# Patient Record
Sex: Female | Born: 1970
Health system: Southern US, Community
[De-identification: ages and names within clinical notes are randomized; demographics above are authoritative.]

## PROBLEM LIST (undated history)

## (undated) DIAGNOSIS — R011 Cardiac murmur, unspecified: Secondary | ICD-10-CM

## (undated) DIAGNOSIS — R112 Nausea with vomiting, unspecified: Secondary | ICD-10-CM

## (undated) DIAGNOSIS — B009 Herpesviral infection, unspecified: Secondary | ICD-10-CM

## (undated) DIAGNOSIS — N94819 Vulvodynia, unspecified: Secondary | ICD-10-CM

## (undated) DIAGNOSIS — F329 Major depressive disorder, single episode, unspecified: Secondary | ICD-10-CM

## (undated) DIAGNOSIS — Z8719 Personal history of other diseases of the digestive system: Secondary | ICD-10-CM

## (undated) DIAGNOSIS — S0993XA Unspecified injury of face, initial encounter: Secondary | ICD-10-CM

## (undated) DIAGNOSIS — R102 Pelvic and perineal pain: Secondary | ICD-10-CM

## (undated) DIAGNOSIS — IMO0002 Reserved for concepts with insufficient information to code with codable children: Secondary | ICD-10-CM

## (undated) DIAGNOSIS — B379 Candidiasis, unspecified: Secondary | ICD-10-CM

## (undated) DIAGNOSIS — R87619 Unspecified abnormal cytological findings in specimens from cervix uteri: Secondary | ICD-10-CM

## (undated) DIAGNOSIS — N809 Endometriosis, unspecified: Secondary | ICD-10-CM

## (undated) DIAGNOSIS — Z9889 Other specified postprocedural states: Secondary | ICD-10-CM

## (undated) DIAGNOSIS — F419 Anxiety disorder, unspecified: Secondary | ICD-10-CM

## (undated) DIAGNOSIS — F32A Depression, unspecified: Secondary | ICD-10-CM

## (undated) HISTORY — DX: Personal history of other diseases of the digestive system: Z87.19

## (undated) HISTORY — DX: Reserved for concepts with insufficient information to code with codable children: IMO0002

## (undated) HISTORY — DX: Unspecified abnormal cytological findings in specimens from cervix uteri: R87.619

## (undated) HISTORY — DX: Endometriosis, unspecified: N80.9

## (undated) HISTORY — DX: Candidiasis, unspecified: B37.9

## (undated) HISTORY — DX: Unspecified injury of face, initial encounter: S09.93XA

## (undated) HISTORY — PX: PELVIC LAPAROSCOPY: SHX162

## (undated) HISTORY — DX: Vulvodynia, unspecified: N94.819

## (undated) HISTORY — DX: Herpesviral infection, unspecified: B00.9

## (undated) HISTORY — DX: Pelvic and perineal pain: R10.2

## (undated) HISTORY — DX: Anxiety disorder, unspecified: F41.9

---

## 1995-06-10 HISTORY — PX: BREAST REDUCTION SURGERY: SHX8

## 1998-09-06 ENCOUNTER — Ambulatory Visit (HOSPITAL_COMMUNITY): Admission: RE | Admit: 1998-09-06 | Discharge: 1998-09-06 | Payer: Self-pay | Admitting: Obstetrics and Gynecology

## 1998-09-06 ENCOUNTER — Encounter: Payer: Self-pay | Admitting: Obstetrics and Gynecology

## 1998-11-18 ENCOUNTER — Inpatient Hospital Stay (HOSPITAL_COMMUNITY): Admission: AD | Admit: 1998-11-18 | Discharge: 1998-11-18 | Payer: Self-pay | Admitting: *Deleted

## 1999-02-05 ENCOUNTER — Inpatient Hospital Stay (HOSPITAL_COMMUNITY): Admission: AD | Admit: 1999-02-05 | Discharge: 1999-02-08 | Payer: Self-pay | Admitting: Obstetrics and Gynecology

## 2000-06-09 HISTORY — PX: TUBAL LIGATION: SHX77

## 2000-11-01 ENCOUNTER — Emergency Department (HOSPITAL_COMMUNITY): Admission: EM | Admit: 2000-11-01 | Discharge: 2000-11-01 | Payer: Self-pay | Admitting: Emergency Medicine

## 2001-01-10 ENCOUNTER — Inpatient Hospital Stay (HOSPITAL_COMMUNITY): Admission: AD | Admit: 2001-01-10 | Discharge: 2001-01-12 | Payer: Self-pay | Admitting: Obstetrics and Gynecology

## 2001-01-10 ENCOUNTER — Encounter (INDEPENDENT_AMBULATORY_CARE_PROVIDER_SITE_OTHER): Payer: Self-pay | Admitting: Specialist

## 2001-09-08 ENCOUNTER — Other Ambulatory Visit: Admission: RE | Admit: 2001-09-08 | Discharge: 2001-09-08 | Payer: Self-pay | Admitting: Obstetrics and Gynecology

## 2002-09-27 ENCOUNTER — Other Ambulatory Visit: Admission: RE | Admit: 2002-09-27 | Discharge: 2002-09-27 | Payer: Self-pay | Admitting: Obstetrics and Gynecology

## 2003-06-10 HISTORY — PX: ABDOMINAL HYSTERECTOMY: SHX81

## 2003-09-25 ENCOUNTER — Encounter: Admission: RE | Admit: 2003-09-25 | Discharge: 2003-09-25 | Payer: Self-pay | Admitting: Allergy and Immunology

## 2003-10-02 ENCOUNTER — Other Ambulatory Visit: Admission: RE | Admit: 2003-10-02 | Discharge: 2003-10-02 | Payer: Self-pay | Admitting: Obstetrics and Gynecology

## 2003-10-05 ENCOUNTER — Ambulatory Visit (HOSPITAL_COMMUNITY): Admission: RE | Admit: 2003-10-05 | Discharge: 2003-10-05 | Payer: Self-pay | Admitting: Obstetrics and Gynecology

## 2003-10-05 ENCOUNTER — Encounter (INDEPENDENT_AMBULATORY_CARE_PROVIDER_SITE_OTHER): Payer: Self-pay | Admitting: Specialist

## 2004-05-30 ENCOUNTER — Ambulatory Visit (HOSPITAL_COMMUNITY): Admission: RE | Admit: 2004-05-30 | Discharge: 2004-05-30 | Payer: Self-pay | Admitting: Gastroenterology

## 2004-09-30 ENCOUNTER — Other Ambulatory Visit: Admission: RE | Admit: 2004-09-30 | Discharge: 2004-09-30 | Payer: Self-pay | Admitting: Obstetrics and Gynecology

## 2005-02-03 ENCOUNTER — Encounter (INDEPENDENT_AMBULATORY_CARE_PROVIDER_SITE_OTHER): Payer: Self-pay | Admitting: *Deleted

## 2005-02-03 ENCOUNTER — Observation Stay (HOSPITAL_COMMUNITY): Admission: RE | Admit: 2005-02-03 | Discharge: 2005-02-04 | Payer: Self-pay | Admitting: Obstetrics and Gynecology

## 2005-05-28 IMAGING — CT CT PARANASAL SINUSES LIMITED
1 series · 16 of 24 positions shown, 20 images · non-contrast
Comparison: none

CLINICAL DATA: Facial pain and sinus congestion.
 CT OF THE SINUSES LIMITED WITHOUT CONTRAST
 With the patient in the extended prone position, contiguous 3.75 mm cuts were obtained through the paranasal sinuses.

[Series 2: — · axial · 0.33mm/px · z∈[+17,+102]mm · 16 of 24 slices shown, 20 images]
[im 2/24  brain]
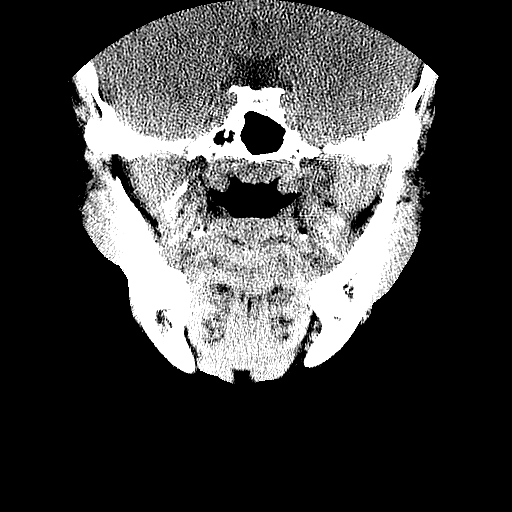
[im 2/24  bone]
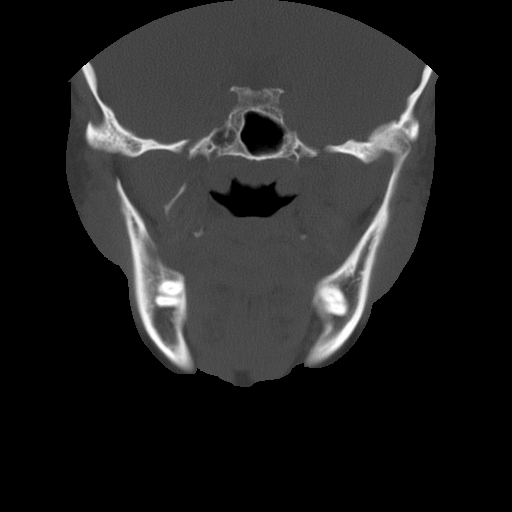
[im 4/24  bone]
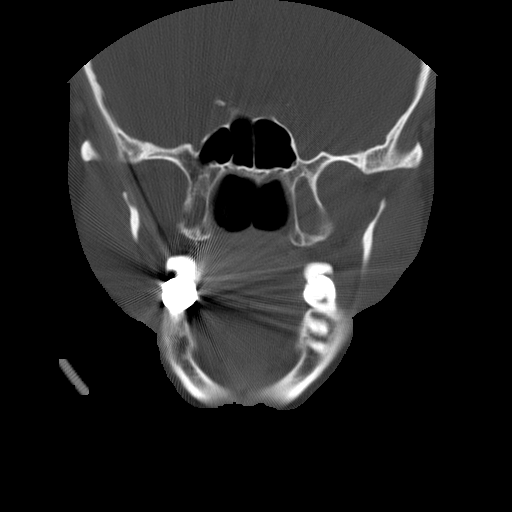
[im 5/24  bone]
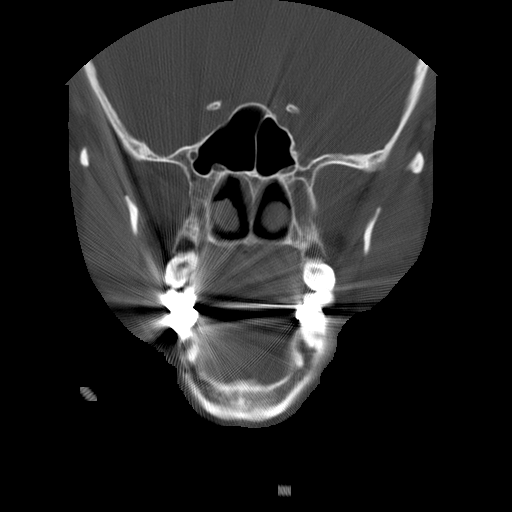
[im 6/24  bone]
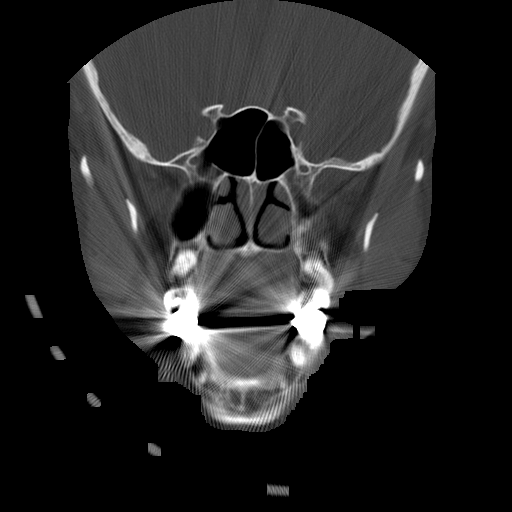
[im 8/24  brain]
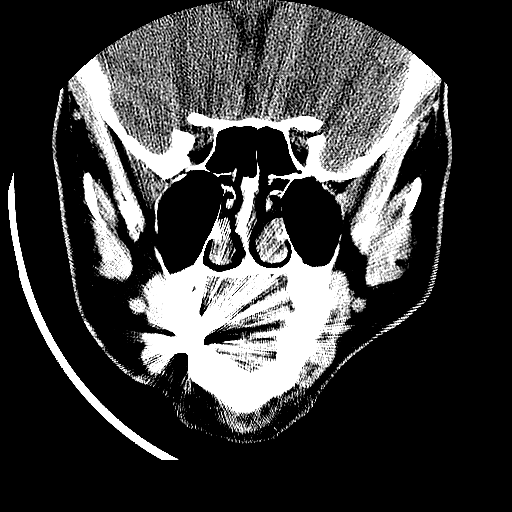
[im 8/24  bone]
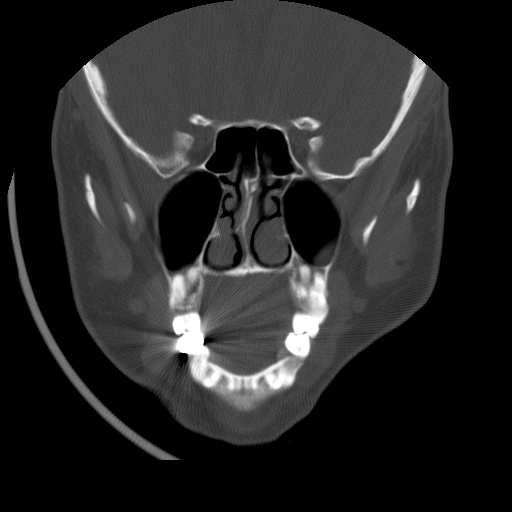
[im 9/24  bone]
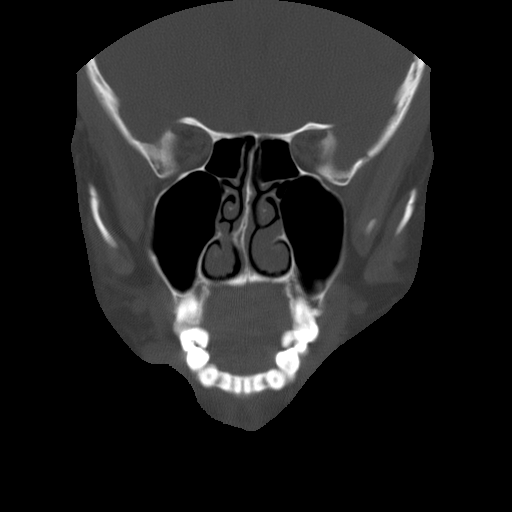
[im 10/24  bone]
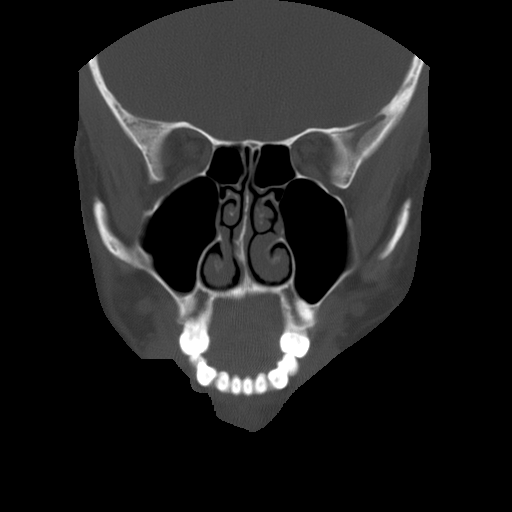
[im 12/24  bone]
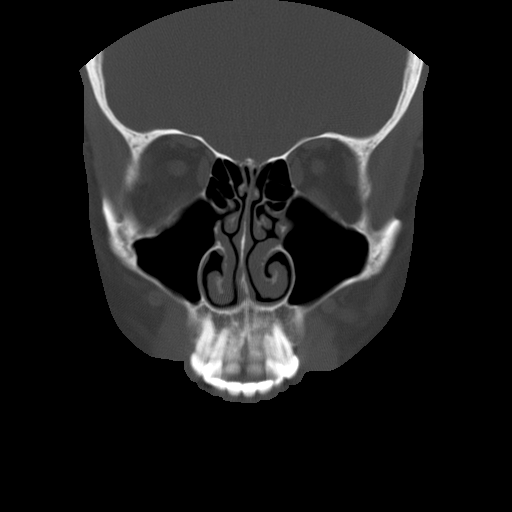
[im 13/24  brain]
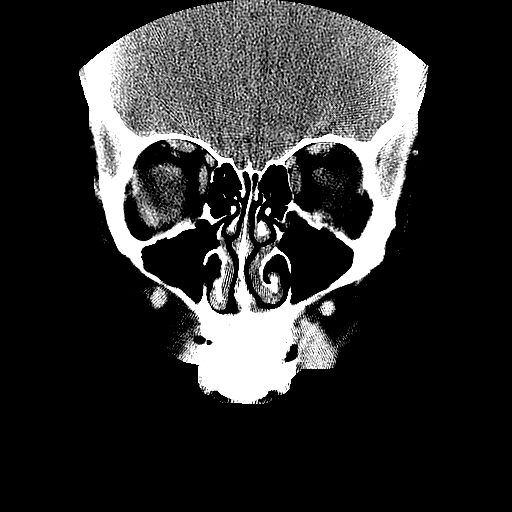
[im 13/24  bone]
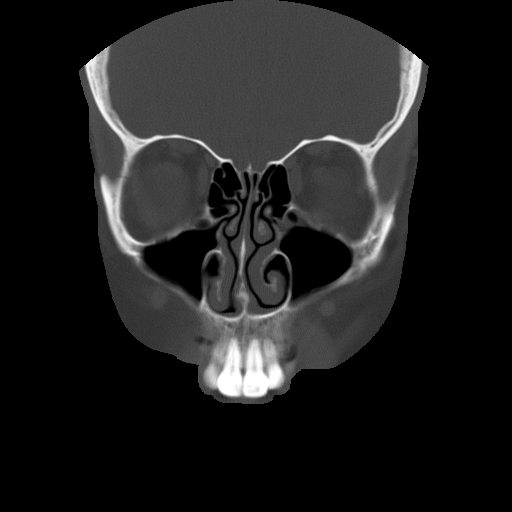
[im 15/24  bone]
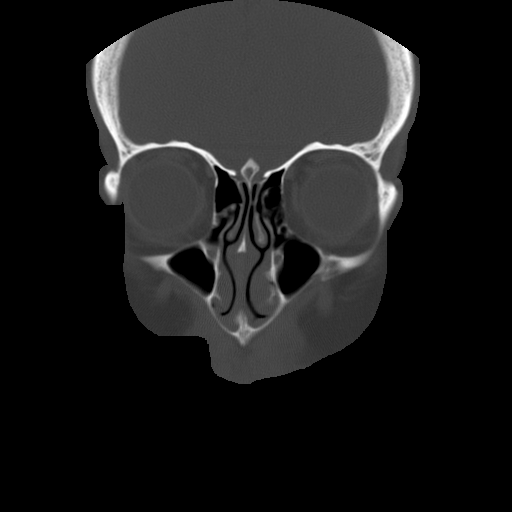
[im 16/24  bone]
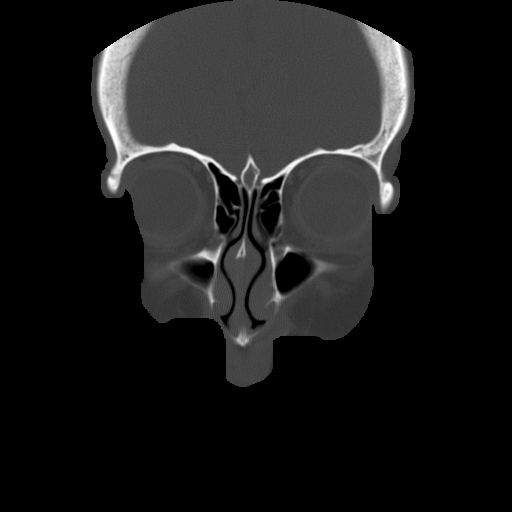
[im 17/24  bone]
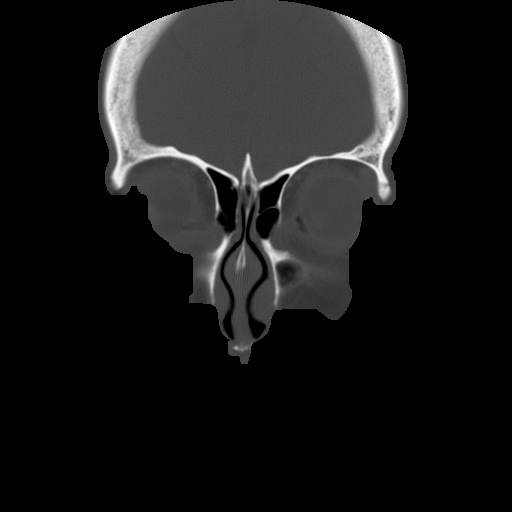
[im 19/24  brain]
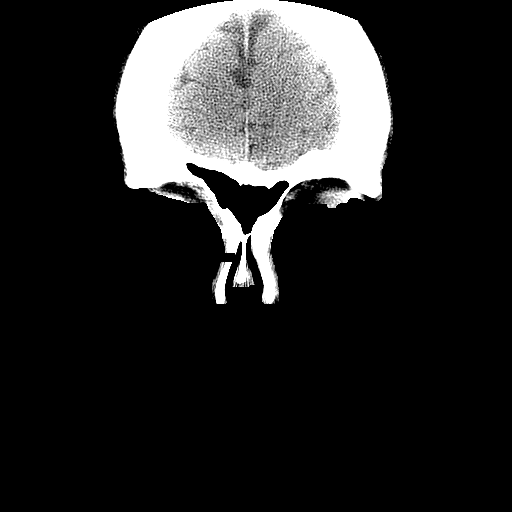
[im 19/24  bone]
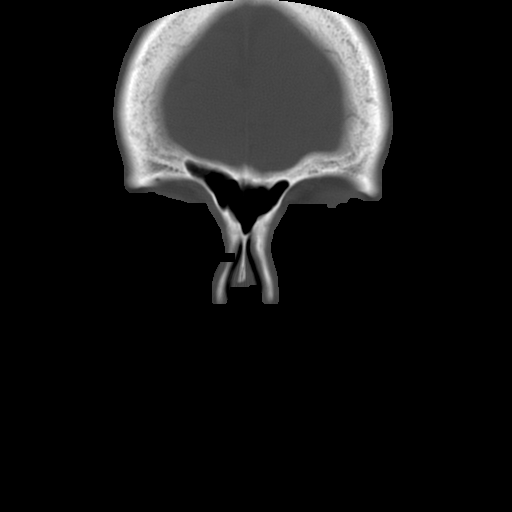
[im 20/24  bone]
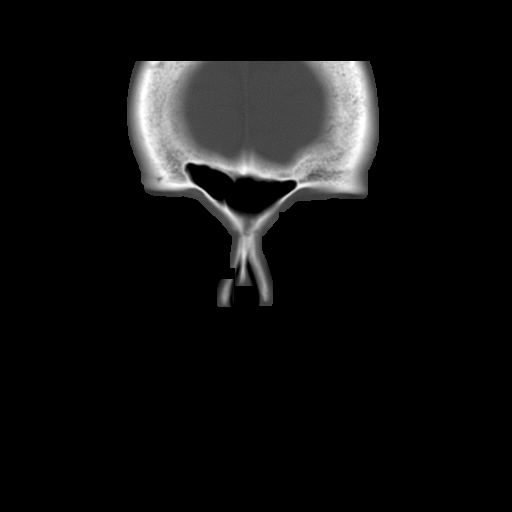
[im 21/24  bone]
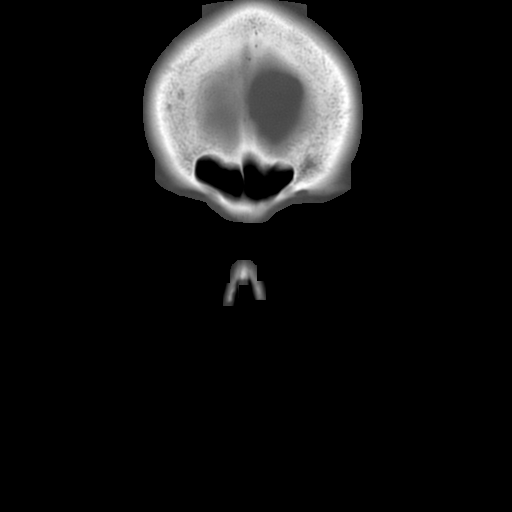
[im 23/24  bone]
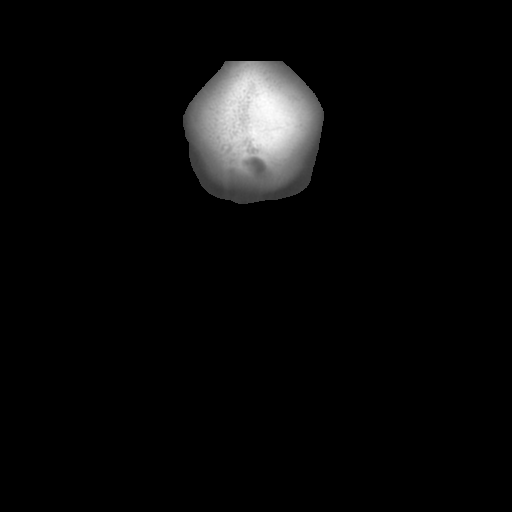

[16 of 24 positions shown; findings below may reference images not displayed]

Frontal and ethmoid sinuses clear.  Mild bilateral maxillary mucosal thickening.  Sphenoid sinus is normal.  The ostiomeatal unit cannot be fully evaluated on a limited study such as this.  However, there appears to be no grossly unfavorable anatomic.
 IMPRESSION
 Mild bilateral maxillary mucosal thickening.  Otherwise normal.

## 2005-11-05 ENCOUNTER — Other Ambulatory Visit: Admission: RE | Admit: 2005-11-05 | Discharge: 2005-11-05 | Payer: Self-pay | Admitting: Obstetrics and Gynecology

## 2006-01-31 ENCOUNTER — Emergency Department (HOSPITAL_COMMUNITY): Admission: EM | Admit: 2006-01-31 | Discharge: 2006-01-31 | Payer: Self-pay | Admitting: Emergency Medicine

## 2009-11-06 ENCOUNTER — Ambulatory Visit: Payer: Self-pay | Admitting: Internal Medicine

## 2009-11-06 DIAGNOSIS — R198 Other specified symptoms and signs involving the digestive system and abdomen: Secondary | ICD-10-CM

## 2009-11-06 DIAGNOSIS — R1032 Left lower quadrant pain: Secondary | ICD-10-CM | POA: Insufficient documentation

## 2009-11-06 DIAGNOSIS — R635 Abnormal weight gain: Secondary | ICD-10-CM | POA: Insufficient documentation

## 2009-11-06 DIAGNOSIS — R197 Diarrhea, unspecified: Secondary | ICD-10-CM

## 2009-11-08 ENCOUNTER — Encounter: Payer: Self-pay | Admitting: Gastroenterology

## 2009-11-26 ENCOUNTER — Encounter: Payer: Self-pay | Admitting: Internal Medicine

## 2009-12-12 ENCOUNTER — Encounter (INDEPENDENT_AMBULATORY_CARE_PROVIDER_SITE_OTHER): Payer: Self-pay

## 2009-12-12 LAB — CONVERTED CEMR LAB
ALT: 11 units/L (ref 0–35)
AST: 16 units/L (ref 0–37)
Albumin: 4.6 g/dL (ref 3.5–5.2)
Alkaline Phosphatase: 40 units/L (ref 39–117)
BUN: 18 mg/dL (ref 6–23)
Basophils Absolute: 0 10*3/uL (ref 0.0–0.1)
Basophils Relative: 0 % (ref 0–1)
CO2: 21 meq/L (ref 19–32)
Calcium: 9.5 mg/dL (ref 8.4–10.5)
Chloride: 105 meq/L (ref 96–112)
Creatinine, Ser: 0.68 mg/dL (ref 0.40–1.20)
Eosinophils Absolute: 0 10*3/uL (ref 0.0–0.7)
Eosinophils Relative: 1 % (ref 0–5)
Glucose, Bld: 84 mg/dL (ref 70–99)
HCT: 38.9 % (ref 36.0–46.0)
Hemoglobin: 13 g/dL (ref 12.0–15.0)
Lymphocytes Relative: 27 % (ref 12–46)
Lymphs Abs: 1.7 10*3/uL (ref 0.7–4.0)
MCHC: 33.4 g/dL (ref 30.0–36.0)
MCV: 98 fL (ref 78.0–100.0)
Monocytes Absolute: 0.6 10*3/uL (ref 0.1–1.0)
Monocytes Relative: 10 % (ref 3–12)
Neutro Abs: 4 10*3/uL (ref 1.7–7.7)
Neutrophils Relative %: 62 % (ref 43–77)
Platelets: 200 10*3/uL (ref 150–400)
Potassium: 4.5 meq/L (ref 3.5–5.3)
RBC: 3.97 M/uL (ref 3.87–5.11)
RDW: 12.7 % (ref 11.5–15.5)
Sodium: 137 meq/L (ref 135–145)
TSH: 1.902 microintl units/mL (ref 0.350–4.500)
Total Bilirubin: 0.3 mg/dL (ref 0.3–1.2)
Total Protein: 7.5 g/dL (ref 6.0–8.3)
WBC: 6.4 10*3/uL (ref 4.0–10.5)

## 2010-07-09 NOTE — Assessment & Plan Note (Signed)
Summary: ABD PAIN,DIARRHEA/CM   Visit Type:  New patient Primary Care Provider:  [facility]  Chief Complaint:  abd pain, diarrhea, and weight gain.  History of Present Illness: Michelle Combs is a pleasant 40 y/o WF, who presents today for further evaluation of abd pain, diarrhea, weight gain. She states her symptoms began about 9 months ago. She came off Imipramine which she had been taking for migraine H/As. She was on it for five years, 150mg  per day. The last two months she weaned herself off. She also switched from Nexium to omeprazole and added Wellbutrin around the time her symptoms began. She went from constipation to having profuse diarrhea several days per week. No nocturnal symptoms. No blood in stool. When the diarrhea occurs, she goes to bathroom all day long, finally passing nothing but watery stools. She has some mild intermittent LLQ pain. She describes three episodes of feeling like someone has punched her in mid-abdomen. This lasted for one day. Not brought on by meals. She rarely has heartburn, but will take omeprazole as needed. She was diagnosed with GERD by ENT in 2005 when she presented with hoarseness. Nexium was started at that time and her symptoms resolved. She had EGD by Dr. Laural Benes at that time which was normal. She c/o weight gain of 10 pounds recently with no dietary changes.      Current Medications (verified): 1)  Wellbutrin Xl 300 Mg Xr24h-Tab (Bupropion Hcl) .... Once Daily 2)  Omeprazole 20 Mg Cpdr (Omeprazole) .... As Needed  Allergies (verified): No Known Drug Allergies  Past History:  Past Medical History: EGD, Dr. Danise Edge, 2005-->normal Incomplete TCS to ascending colon secondary to looping, Dr. Danise Edge, 2005--> GERD Migraines Depression  Past Surgical History: Hysterectomy for chronic pelvic pain, endometriosis, 2006 Dx lap for endometriosis, 2005 Tubal ligation, 2002 Breast reduction, 1998  Family History: No FH  of CRC, liver, chronic GI illnesses.  Social History: Married. Two children. [employer] Never smoked. Red wine, glass a night. No drugs use.  Review of Systems General:  Denies fever, chills, sweats, anorexia, fatigue, weakness, and weight loss. Eyes:  Denies vision loss. ENT:  Denies nasal congestion, sore throat, hoarseness, and difficulty swallowing. CV:  Denies chest pains, angina, dyspnea on exertion, and peripheral edema. Resp:  Denies dyspnea at rest, dyspnea with exercise, and cough. GI:  See HPI. GU:  Denies urinary burning and blood in urine. MS:  Denies joint pain / LOM. Derm:  Denies rash and itching. Neuro:  Complains of headache; denies weakness, frequent headaches, memory loss, and confusion. Psych:  Complains of depression; denies anxiety, memory loss, and suicidal ideation. Endo:  Complains of unusual weight change. Heme:  Denies bruising and bleeding. Allergy:  Denies hives and rash.  Vital Signs:  Patient profile:   40 year old female Height:      62 inches Weight:      137 pounds BMI:     25.15 Temp:     97.4 degrees F oral Pulse rate:   80 / minute BP sitting:   118 / 78  (left arm) Cuff size:   regular  Vitals Entered By: Hendricks Limes LPN (Nov 06, 2009 8:34 AM)  Physical Exam  General:  Well developed, well nourished, no acute distress. Head:  Normocephalic and atraumatic. Eyes:  Conjunctivae pink, no scleral icterus.  Mouth:  Oropharyngeal mucosa moist, pink.  No lesions, erythema or exudate.    Neck:  Supple; no masses or thyromegaly.  Lungs:  Clear throughout to auscultation. Heart:  Regular rate and rhythm; no murmurs, rubs,  or bruits. Abdomen:  Normal BS. Soft. Mild LLQ tenderness to deep palpation. No rebound or guarding. No HSM or masses. Extremities:  No clubbing, cyanosis, edema or deformities noted. Neurologic:  Alert and  oriented x4;  grossly normal neurologically. Skin:  Intact without significant lesions or  rashes. Cervical Nodes:  No significant cervical adenopathy. Psych:  Alert and cooperative. Normal mood and affect.  Impression & Recommendations:  Problem # 1:  CHANGE IN BOWELS (ICD-787.99) Change in bowels from constipation to diarrhea several days per week. Change noted around time she stopped imipramine and started wellbutrin. Previously had incomplete TCS for rectal bleeding in 2005. ?underlying IBS masked by use of imipramine before. Need to obtain routine labs and stools. Will add fiber, hyomax. May ultimately need TCS/TI for further evaluation.  Orders: New Patient Level III [phone]) T-TSH [phone]) T-Comprehensive Metabolic Panel [id number]) T-CBC w/Diff [id number])  Problem # 2:  DIARRHEA (ICD-787.91) see #1 Orders: New Patient Level III ([address]295) T-Comprehensive Metabolic Panel [id number]) T-CBC w/Diff [id number]) T-Culture, Stool ([id number]) T-Stool Giardia / Crypto- EIA (03474) T-Fecal WBC ([id number]) T-Culture, C-Diff Toxin A/B ([id number])  Problem # 3:  ABDOMINAL PAIN, LEFT LOWER QUADRANT (ICD-789.04) LLQ tenderness on exam. ?IBS? Trial of hyomax and w/u per #1. Orders: New Patient Level III (16606) T-Comprehensive Metabolic Panel [id number]) T-CBC w/Diff [id number]) T-Culture, Stool ([id number]) T-Stool Giardia / Crypto- EIA (42706) T-Fecal WBC ([id number]) T-Culture, C-Diff Toxin A/B ([id number])  Problem # 4:  WEIGHT GAIN (ICD-783.1) ?related to medication. Will check thyroid function. Orders: New Patient Level III (62694) T-TSH [id number])  Patient Instructions: 1)  Please start fiber supplement such as Benefiber or Fiberchoice, one dose daily. 2)  Take hyoscyamine sublingual for diarrhea.  3)  Have labs and stool studies done as soon as you can.  4)  The medication list was reviewed and reconciled.  All changed / newly prescribed medications were explained.  A complete medication list was provided to the patient /  caregiver. Prescriptions: HYOMAX-SL 0.125 MG SUBL (HYOSCYAMINE SULFATE) one by mouth SL up to four times daily for diarrhea.  #90 x 0   Entered and Authorized by:   Leanna Battles. Dixon Boos   Signed by:   Leanna Battles Dixon Boos on 11/06/2009   Method used:   Electronically to        [facility] (retail)       [address]       [place], [place]  [address]       Ph: [phone]       Fax: [phone]   RxID:   [id number]

## 2010-07-09 NOTE — Letter (Signed)
Summary: op report-esophagogastroduodenoscopy-colonoscopy-dr. Laural Benes  op report-esophagogastroduodenoscopy-colonoscopy-dr. Laural Benes   Imported By: Rosine Beat 11/26/2009 14:53:40  _____________________________________________________________________  External Attachment:    Type:   Image     Comment:   External Document

## 2010-07-09 NOTE — Letter (Signed)
Summary: Plan of Care, Need to Discuss  [facility]  [address]   [place], [place] [address]   Phone: [phone]  Fax: [phone]    December 12, 2009  Michelle Combs [address] [place], [place]  [address] 1971-01-20   Dear Ms. Polo Riley,   We are writing this letter to inform you of treatment plans and/or discuss your plan of care.  We have tried several times to contact you; however, we have yet to reach you.  We ask that you please contact our office for follow-up on your gastrointestinal issues.  We can  be reached at [phone] to schedule an appointment, or to speak with someone regarding your health care needs.  Please do not neglect your health.   Sincerely,    Cloria Spring LPN  [facility] Ph: [phone]    Fax: [phone]

## 2010-10-25 NOTE — Discharge Summary (Signed)
[facility]  Patient:    Michelle Combs, Michelle Combs                       MRN: [id number] Adm. Date:  [id number] Disc. Date: [id number] Attending:  Shaune Spittle Dictator:   Nigel Bridgeman, C.N.M.                           Discharge Summary  ADMITTING DIAGNOSES:          1. Intrauterine pregnancy at term.                               2. Desires tubal sterilization.  DISCHARGE DIAGNOSES:          1. Intrauterine pregnancy at term.                               2. Desires tubal sterilization.  PROCEDURES:                   1. Spontaneous vaginal delivery at term.                               2. Postpartum tubal sterilization.  HOSPITAL COURSE:              Michelle Combs is a 40 year old, gravida 4, para 1-0-2-1 at 39-3/7 weeks, who presented in active labor on the morning of January 10, 2001. She was 3 to 4 cm with an intact bag of water. Pregnancy had been remarkable for (1) history of two Abs, (2) history of abnormal Pap smear, (3) history of breast reduction, (4) positive group B strep, and (5) first trimester spotting. Penicillin G was initiated for group B strep prophylaxis. The patient had an epidural but then progressed very rapidly to a normal spontaneous vaginal delivery over an intact perineum. She had a viable female by the name of Kaleb, weight 7 pounds 12 ounces, Apgars were 9 and 9. The patient had no complications. The infant was taken to the full-term nursery. Mother was taken to the recovery room in good condition. The patient did desire tubal sterilization. Risks and benefits were reviewed with Dr. Pennie Rushing. Later on the morning of January 10, 2001 she was taken to the operating room where a postpartum bilateral tubal ligation was performed under existing epidural and additional local anesthesia. The patient had no complications. Blood loss was minimal.  By postoperative day #1, postpartum day #1, the patient was doing well. Her hemoglobin was  9.5, down from 11.1. WBC count was 7.8. Physical exam was within normal limits. Her platelet count was 111,000, down from 145,000. She was tolerating a regular diet. She was bottle feeding.  On postpartum day #2, postoperative day #2, she was up ad lib. Her incision was clean, dry and intact. Her physical exam was within normal limits. She was deemed to have received the full benefit of her hospital stay, and was discharged home.  DISCHARGE INSTRUCTIONS:       Instructions per [facility] handout.  DISCHARGE MEDICATIONS:        1. Motrin 600 mg p.o. q.6h. p.r.n. pain.  2. Tylox one to two p.o. q.3-4h. p.r.n. pain.  DISCHARGE FOLLOWUP:           The patient is to follow up in six weeks at [facility]. DD:  01/12/01 TD:  01/12/01 Job: [id number]

## 2010-10-25 NOTE — H&P (Signed)
NAME:  Michelle Combs, Michelle Combs                          ACCOUNT NO.:  [id number]   MEDICAL RECORD NO.:  [id number]                   PATIENT TYPE:  AMB   LOCATION:  [facility]                                  FACILITY:  [facility]   PHYSICIAN:  Hal Morales, M.D.             DATE OF BIRTH:  1971/05/14   DATE OF ADMISSION:  10/05/2003  DATE OF DISCHARGE:                                HISTORY & PHYSICAL   HISTORY OF PRESENT ILLNESS:  The patient is a 40 year old white married  female, gravida 4, para 2-0-0-2 who presents for diagnostic laparoscopy for  evaluation of pelvic pain, dysmenorrhea and dyspareunia.  The patient states  that she has always had fairly severe menstrual cramps; however, her current  illness dates back to the time immediately after the delivery of her last  son in August of 2002.  Since that time, she has had progressive  dysmenorrhea and some menstrual irregularity.  She did have a tubal ligation  immediately following her delivery and thus had discontinued birth control  pills which she had used previously for contraception and had noted regular  menses on those.  She began to report prolonged menses in January of 2004  and at that time had a normal TSH, prolactin and hemoglobin.  Ultrasound  done at that time revealed a normal sized uterus measuring 8.3 x 3.9 x 4.9  and bilateral simple ovarian cyst each less than 2 cm.  The combination of  her deep dyspareunia, dysmenorrhea, decreased fertility and irregular menses  was all consistent with endometriosis and she was treated presumptively with  Aygestin 15 mg a day.  She only used the Aygestin for two weeks because she  noted significant vaginal dryness with it.  Over the next three months, she  had regular menses lasting for three days but continued to have significant  back pain.  At that  time, she was using Motrin 600 mg once a day for good  relief of her back pain.  In December of 2004, she gave a history of  continued severe  back pain and cramps with her menstrual periods especially  when passing clots.  She also complained of deep dyspareunia.  She had been  prescribed naproxen sodium but did not take that medication as prescribed.  At that time, a discussion was held with the patient concerning options for  management of her symptoms which included laparoscopy with resection of  visible endometriosis, continuous oral contraceptive pill, Aygestin, Depo-  Provera, oral Provera and Lupron.  The patient asked questions concerning  the advisability of hysterectomy for her symptoms and an explanation of the  limitations of hysterectomy alone for relief of symptoms of endometriosis  was reviewed.  The patient was given written literature and returned October 02, 2003 asking that we proceed with laparoscopy for additional information  concerning her pelvic pain, dysmenorrhea and dyspareunia.   PAST MEDICAL HISTORY:  The patient has been treated for anxiety with SSRI  antidepressants but discontinued those. She continues to complain of  episodes of anxiety associated with stressful situations. She has never had  a suicidal attempt and she denies being depressed.   PAST SURGICAL HISTORY:  Reduction mammoplasty in 1998, dilatation and  evacuation for spontaneous abortion in September of 1998 and December of  1998.   OBSTETRICAL HISTORY:  The patient had two spontaneous abortions as mentioned  above and two normal spontaneous vaginal deliveries, one in 2000 and the  other in 2002.   GYN HISTORY:  The patient uses tubal ligation for contraception. She had a  history of an abnormal Pap smear at age 62 but has had normal Pap smears  since that time without treatment.  She has had the aforementioned  difficulty with dysmenorrhea, irregular menses.   CURRENT MEDICATIONS:  Antihypertensives, Anaprox p.r.n. cramps, Vicodin  p.r.n. cramps not relieved by Anaprox.   DRUG SENSITIVITIES:  No known drug sensitivity.    FAMILY HISTORY:  Maternal grandfather with lung cancer, sister with  epilepsy.   REVIEW OF SYMPTOMS:  Positive for anxiety and the above noted positive GYN  review of systems.   PHYSICAL EXAMINATION:  GENERAL:  The patient is a well-developed, white  female in no acute distress.  VITAL SIGNS:  Blood pressure is 120/80.  LUNGS:  Clear.  HEART:  Regular rate and rhythm.  BREASTS:  No dominant masses, discharge, skin changes or nipple retraction.  There are well healed incisions status post reduction mammoplasty.  ABDOMEN:  Soft without masses or organomegaly.  EXTREMITIES:  No cyanosis, clubbing or edema.  PELVIC:  EGBUS within normal limits.  The vagina is rugose, the cervix is  without gross lesions and mildly tender to lateral motion.  The uterus is  normal, size, shape and consistency with good mobility and mild tenderness  on mobility. The adnexa are without masses but there is bilateral mild  tenderness.  The rectovaginal examination is without masses though there is  uterosacral ligament tenderness.   IMPRESSION:  1. Pelvic pain.  2. Dysmenorrhea.  3. Dyspareunia.  4. Irregular menses.   DISPOSITION:  Several discussions have been held with the patient concerning  indications for her procedure as well as the risks involved which include  but are not limited to anesthesia, bleeding, infection, and damage to  adjacent organs.  The patient understands that no guarantee can be made  concerning the relief of her pain, nor an actual diagnosis associated with  her pain. She has acknowledged that she wishes to proceed with her surgery  but wants an antianxiety agent preoperatively. She is prescribed Xanax 0.5  mg to be taken three times a day one day prior to her surgery and h.s. the  night prior to her surgery.  She will undergo diagnostic laparoscopy at  [facility] on October 05, 2003.                                              Hal Morales, M.D.    VPH/MEDQ  D:   10/03/2003  T:  10/03/2003  Job:  [id number]

## 2010-10-25 NOTE — Op Note (Signed)
NAME:  Michelle Combs, Michelle Combs                          ACCOUNT NO.:  [id number]   MEDICAL RECORD NO.:  [id number]                   PATIENT TYPE:  AMB   LOCATION:  [facility]                                  FACILITY:  [facility]   PHYSICIAN:  Hal Morales, M.D.             DATE OF BIRTH:  November 19, 1970   DATE OF PROCEDURE:  10/05/2003  DATE OF DISCHARGE:                                 OPERATIVE REPORT   PREOPERATIVE DIAGNOSES:  Pelvic pain and dysmenorrhea.   POSTOPERATIVE DIAGNOSES:  Pelvic pain, dysmenorrhea, and endometriosis.   OPERATION:  Diagnostic laparoscopy, excisional biopsy of endometriosis, and  ablation of endometriosis.   SURGEON:  Hal Morales, M.D.   ANESTHESIA:  General orotracheal.   ESTIMATED BLOOD LOSS:  Less than 20 mL.   COMPLICATIONS:  None.   FINDINGS:  The uterus was normal size.  The tubes were status post  interruption for tubal sterilization.  The ovaries were normal bilaterally  without adhesions or evidence of endometriosis.  The anterior cul-de-sac was  normal.  The posterior cul-de-sac had a pinpoint area of powder-burn-type  lesion in the midline consistent with endometriosis.  The left pelvic  sidewall also had a white scarred area consistent with endometriosis.   PROCEDURE:  The patient was taken to the operating room after appropriate  identification and placed on the operating table.  After the obtainment of  adequate general anesthesia, the patient was placed in the modified  lithotomy position.  The abdomen, perineum and vagina were prepped in  multiple layers with Betadine and a Foley catheter inserted into the bladder  and connected to straight drainage.  A Hulka tenaculum was placed on the  uterus and the abdomen draped as a sterile field.  Subumbilical and  suprapubic injection of 0.25% Marcaine for a total of 10 cc was undertaken.  A subumbilical incision was made and a Veress cannula placed through that  incision into the peritoneal cavity.  A  pneumoperitoneum was created with  3.5 L of CO2.  The Veress cannula was removed and the laparoscopic trocar  placed through that incision into the peritoneal cavity.  A laparoscope was  placed through the trocar sleeve.  A suprapubic incision was made to the  left of midline and a laparoscope probe trocar placed through that incision  into the peritoneal cavity under direct visualization.  The above-noted  findings were made and documented.   The posterior cul-de-sac endometriosis lesion was cauterized with bipolar  cautery.  The left pelvic sidewall lesion was excisionally biopsied and sent  for pathologic evaluation.  Hemostasis was noted to be adequate and all  instruments were removed from the peritoneal cavity under direct  visualization as the CO2 was allowed to escape.  The subumbilical incision  was closed with a fascial stitch of 0 Vicryl and a skin stitch of 3-0 Vicryl  in a subcuticular fashion.  The suprapubic incision was closed  with a deep  stitch of 0 Vicryl for hemostasis and a subcuticular suture  of 3-0 Vicryl.  The Hulka tenaculum and Foley catheter were removed and the  patient was awakened from general anesthesia and taken to the recovery room  in satisfactory condition having tolerated the procedure well with sponge  and instrument counts correct.  Specimens to pathology:  excisional biopsy  of left pelvic sidewall endometriosis.                                               Hal Morales, M.D.    VPH/MEDQ  D:  10/05/2003  T:  10/05/2003  Job:  [id number]

## 2010-10-25 NOTE — Discharge Summary (Signed)
[name], Combs                ACCOUNT NO.:  [id number]   MEDICAL RECORD NO.:  [id number]          PATIENT TYPE:  OBV   LOCATION:  9399                          FACILITY:  [facility]   PHYSICIAN:  Hal Morales, M.D.DATE OF BIRTH:  06-02-1971   DATE OF ADMISSION:  02/03/2005  DATE OF DISCHARGE:  02/04/2005                                 DISCHARGE SUMMARY   DISCHARGE DIAGNOSIS:  1.  Pelvic pain.  2.  Endometriosis.   OPERATION:  On the day of admission the patient underwent laparoscopy with a  total vaginal hysterectomy tolerating procedure well. The patient was found  to have a uterus that was normal size. The patient was status post bilateral  tubal interruption for sterilization. The ovaries were normal without  stigmata of endometriosis or adhesions. The anterior cul-de-sac contained a  single adhesive band that crossed the anterior cul-de-sac. The posterior cul-  de-sac contained no obvious evidence of endometriosis. There was a previous  biopsy site at the center of the uterosacral ligament.   HISTORY OF PRESENT ILLNESS:  [name] is a 40 year old married white female  who is status post bilateral tubal interruption, para 2-0-2-2 who presents  for hysterectomy because of symptomatic endometriosis in the form of severe  pelvic pain, dyspareunia and dysmenorrhea. Please see the patient's dictated  history and physical examination for details.   PREOPERATIVE PHYSICAL EXAM:  VITAL SIGNS: Blood pressure 120/76, weight is  152, height is 5 feet 2 inches tall.  GENERAL:  Within normal limits  PELVIC:  EGBUS is within normal limits. Vagina is rugose. Cervix is  nontender without lesions. Uterus appeared normal size, shape and  consistency without tenderness. Adnexa no appreciable masses. However, there  is a left adnexal tenderness. Rectovaginal exam was without masses.   HOSPITAL COURSE:  On the day of admission the patient underwent  aforementioned procedures tolerating them  well. Postoperative course was  unremarkable with patient resuming bowel and bladder function by postop day  #1 and therefore deemed ready for discharge home.   DISCHARGE MEDICATIONS:  1.  Tylox 1-2 tablets every 4-6 hours as needed for pain.  2.  Ibuprofen 600 milligrams 1 tablet with food every 6 hours for 3 days      then as needed for pain  3.  Phenergan 1 tablet every 6 hours for nausea  4.  Colace 100 milligrams 1 tablet two to three times daily until bowel      movements are regular  5.  Imipramine 100 milligrams 1 tablet at bedtime  6.  Prilosec OTC 2 tablets daily  7.  Multivitamins 1 tablet daily.   FOLLOW-UP:  The patient is scheduled for a 6 weeks postoperative visit with  Dr. Pennie Rushing on March 18, 2005 at 11:00 a.m.   POSTOP INSTRUCTIONS:  The patient was given a copy of [facility] postoperative instruction sheet. She was further advised to avoid  driving for 2 weeks, heavy lifting for 4 weeks, intercourse for 6 weeks, to  increase activity slowly, that she may shower and that she may walk up  steps.  The patient's diet was without restriction.   FINAL PATHOLOGY:  Uterus and cervix: Chronic cervicitis with squamous  metaplasia, no intraepithelial lesion identified. Endometrium; inactive  endometrium, no hyperplasia or malignancy identified. Serosa; adhesions.      Michelle Combs.      Hal Morales, M.D.  Electronically Signed    EJP/MEDQ  D:  03/10/2005  T:  03/11/2005  Job:  [id number]

## 2010-10-25 NOTE — Op Note (Signed)
[facility]  Patient:    Michelle Combs, Michelle Combs                       MRN: [id number] Proc. Date: 01/10/01 Adm. Date:  [id number] Attending:  Dierdre Forth Pearline                           Operative Report  PREOPERATIVE DIAGNOSIS:       Desire for surgical sterilization.  POSTOPERATIVE DIAGNOSIS:      Desire for surgical sterilization.  OPERATION:                    Postpartum tubal sterilization.  SURGEON:                      Vanessa P. Pennie Rushing, M.D.  ANESTHESIA:                   Epidural and local.  ESTIMATED BLOOD LOSS:         Less than 10 cc.  COMPLICATIONS:                None.  FINDINGS:                     The tubes were normal for the postpartum state.  PREOPERATIVE DISCUSSION:      Discussions had been held in the antepartum period concerning the patients desire for surgical sterilization.  She wishes no further children and is aware of temporary methods of birth control.  She understands the intended permanence of the procedure and the small risk of failure.  She also understands the risks of anesthesia, bleeding, infection and damage to adjacent organs and wishes to proceed.  DESCRIPTION OF PROCEDURE:     The patient was taken to the operating room after appropriate identification and placed on the operating room table.  Her labor epidural was dosed for surgical anesthesia.  The abdomen was prepped with multiple layers of Betadine and draped as a sterile field.  Subumbilical injection of 10 cc of 0.25% Marcaine was undertaken.  After assurance of adequate anesthesia, a subumbilical incision was made and the peritoneum entered.  The left fallopian tube was identified, followed to its fimbriated end, then grasped at the isthmic portion and elevated.  A suture of 2-0 chromic was placed through the mesosalpinx and tied fore and aft on a knuckle of tube.  A second ligature was placed proximal to this and the intervening knuckle of tube  excised.  The cut ends were cauterized.  Hemostasis was noted to be adequate.  A similar procedure was carried out on the opposite side. The abdominal peritoneum was closed in a pursestring fashion with 0 Vicryl. The fascia was closed in a running fashion with 0 Vicryl.  A subcuticular suture of 3-0 Vicryl was used to close the skin incision.  A sterile dressing was applied.  The patient was taken from the operating room to the recovery room in satisfactory condition, having tolerated the procedure well, with sponge and instrument counts correct. DD:  01/10/01 TD:  01/12/01 Job: [id number]

## 2010-10-25 NOTE — Op Note (Signed)
Michelle Combs, Michelle Combs                ACCOUNT NO.:  [id number]   MEDICAL RECORD NO.:  [id number]          PATIENT TYPE:  AMB   LOCATION:  ENDO                         FACILITY:  [facility]   PHYSICIAN:  Danise Edge, M.D.   DATE OF BIRTH:  Dec 25, 1970   DATE OF PROCEDURE:  05/30/2004  DATE OF DISCHARGE:                                 OPERATIVE REPORT   PROCEDURES:  Esophagogastroduodenoscopy and colonoscopy.   PROCEDURE INDICATION:  [name] is a 40 year old female, born 1970-07-30.  [name] has chronic gastroesophageal reflux and intermittent  painless hematochezia.   [name] gastroesophageal reflux symptoms are manifested by hoarseness  and a nocturnal stomach ache.  She is currently taking Nexium 40 mg twice  daily.  Her stomach ache has completely resolved.  Her hoarseness is slower  to improve.  She reports no dysphagia, odynophagia, nausea, or vomiting.   [name] has intermittent painless hematochezia manifested by blood in her  stool and intermittently dripping from her anus.  She was diagnosed with  hemorrhoids during her pregnancy.  She has undergone laparoscopic therapy  for endometriosis.  There is no personal or family history of colon cancer.  For migraine headaches, she takes imipramine which is probably the cause for  her chronic constipation.   MEDICATION ALLERGIES:  None.   CURRENT MEDICATIONS:  Provera, Nexium, imipramine, ketoprofen, and Motrin.   PAST MEDICAL HISTORY:  1.  Gastroesophageal reflux.  2.  Laparoscopic therapy for endometriosis.  3.  Migraine headaches.   HABITS:  [name] does not smoke cigarettes or consume alcohol.   FAMILY HISTORY:  Both parents are healthy.  Her 32 year old brother is in  good health.  Her 26 year old sister has epilepsy.   SOCIAL HISTORY:  [name]'s 78 year old husband is in great health.  Her two  sons have asthma.   ENDOSCOPIST:  Danise Edge, M.D.   PREMEDICATION:  1.  Versed 12.5 mg.  2.   Demerol 125 mg.   PROCEDURE:  Diagnostic esophagogastroduodenoscopy.   After obtaining informed consent, [name] was placed in the left lateral  decubitus position.  I administered intravenous Demerol and intravenous  Versed to achieve conscious sedation for the procedure.  The patient's blood  pressure, oxygen saturation, and cardiac rhythm were monitored throughout  the procedure and documented in the medical record.   The Olympus gastroscope was passed through the posterior hypopharynx into  the proximal esophagus without difficulty.  The hypopharynx, larynx, and  vocal cords appeared normal.   ESOPHAGOSCOPY:  The proximal, mid, and lower segments of the esophageal  mucosa appear normal.  There was no endoscopic evidence for the presence of  erosive esophagitis, Barrett's esophagus, esophageal mucosal scarring, or  esophageal stricture formation.   GASTROSCOPY:  Retroflexed view of the gastric cardia and fundus was normal.  The gastric body, antrum, and pylorus appear normal.   DUODENOSCOPY:  The duodenal bulb, mid duodenum, and distal duodenum appear  normal.   ASSESSMENT:  Normal esophagogastroduodenoscopy.   PROCEDURE:  Proctocolonoscopy to the mid ascending colon.  Anal inspection  and digital rectal exam  were normal.  The Olympus adjustable pediatric  colonoscope was introduced into the rectum and advanced to the mid ascending  colon.  Due to colonic loop formation, I was unable to intubate the cecum.  Colonic preparation for the exam today was excellent.   RECTUM:  Normal.  SIGMOID COLON AND DESCENDING COLON:  Normal.  SPLENIC FLEXURE:  Normal.  TRANSVERSE COLON:  Normal.  HEPATIC FLEXURE:  Normal.  DISTAL ASCENDING COLON:  Normal.  PROXIMAL ASCENDING COLON, CECUM, AND ILEOCECAL VALVE:  Not examined.   ASSESSMENT:  Normal proctocolonoscopy to the mid ascending colon.      MJ/MEDQ  D:  05/30/2004  T:  05/30/2004  Job:  [id number]   cc:   Jessica Priest, M.D.   [address]  [place] [address]  Fax: [phone]

## 2010-10-25 NOTE — Op Note (Signed)
Michelle Combs, Michelle Combs                ACCOUNT NO.:  [id number]   MEDICAL RECORD NO.:  [id number]          PATIENT TYPE:  INP   LOCATION:  NA                            FACILITY:  [facility]   PHYSICIAN:  Hal Morales, M.D.DATE OF BIRTH:  22-Dec-1970   DATE OF PROCEDURE:  02/03/2005  DATE OF DISCHARGE:                                 OPERATIVE REPORT   PREOPERATIVE DIAGNOSES:  Endometriosis, pelvic pain.   POSTOPERATIVE DIAGNOSES:  Endometriosis, pelvic pain.   OPERATION:  Diagnostic laparoscopy, total vaginal hysterectomy.   SURGEON:  Hal Morales, M.D.   FIRST ASSISTANT:  Elmira J. Adline Peals.   ANESTHESIA:  General orotracheal.   ESTIMATED BLOOD LOSS:  100 mL.   COMPLICATIONS:  None.   FINDINGS:  The uterus was normal-sized. The patient was status post  bilateral tubal interruption for sterilization. The ovaries were normal  without stigmata of endometriosis or adhesions. The anterior cul-de-sac  contained a single adhesive band that crossed the anterior cul-de-sac. The  posterior cul-de-sac contained no obvious evidence of endometriosis. There  was a previous biopsy site at the center of the uterosacral ligaments.   PROCEDURE:  The patient is taken to the operating room after appropriate  identification and placed on the operating table. After the attainment of  adequate general anesthesia she was placed in modified lithotomy position.  The abdomen, perineum and vagina were prepped with multiple layers of  Betadine and a Foley catheter inserted into the bladder under sterile  conditions and connected to straight drainage. A Hulka tenaculum was placed  on the anterior cervix. The abdomen was draped as a sterile field.  Subumbilical and suprapubic injections of 0.25% Marcaine was undertaken  total of 10 mL. The subumbilical incision was made and Veress cannula placed  through that incision into the peritoneal cavity. A pneumoperitoneum was  created with 4 liters of CO2.  The Veress cannula was removed and  laparoscopic trocar placed through the subumbilical incision and the  laparoscope placed through the trocar sleeve. The above-noted findings were  made and documented. The decision was made to proceed with vaginal  hysterectomy. The vagina became the site of operation and the patient was  placed in full lithotomy position. The weighted speculum was placed in the  posterior vagina and Lahey tenaculum placed on the anterior and posterior  surfaces of the cervix. The cervicovaginal mucosa was injected with a dilute  solution of Pitressin and circumscribed. The anterior vaginal mucosa was  dissected off the anterior cervix and the anterior peritoneum entered and  the bladder retractor placed. The posterior peritoneum was entered sharply  and the uterosacral ligaments on the right and left side clamped, cut and  suture ligated with the sutures being held. A paracervical parametrial  tissues were then clamped, cut and suture ligated on right and left side.  The upper pedicles were then clamped, cut and suture ligated and the uterus  removed from the operative field. Free ties were likewise placed on the  upper pedicles. A hemostatic suture was then placed in the left vaginal cuff  and hemostasis  noted to be adequate. A McCall culdoplasty suture was then  placed incorporating the two uterosacral ligaments and the intervening  posterior peritoneum. The vaginal cuff was then closed in a single layer  incorporating the anterior vaginal mucosa, anterior peritoneum, posterior  peritoneum, and posterior vaginal mucosa in figure-of-eight sutures. All  sutures were used were 0 Vicryl. The vaginal angles had been created with  the held uterosacral ligaments placing them through the anterior vaginal  mucosa and posterior vaginal mucosa on either side. After completion of  closure of the vaginal cuff, hemostasis was noted to be adequate and the  vagina was packed with a  2-inch packing moistened with Estrace cream. The  subumbilical incision was then closed with a fascial suture of 0 Vicryl and  a subcuticular suture of 3-0 Vicryl. A sterile dressing was applied to the  subumbilical incision. The patient was awakened from general anesthesia and  taken to the recovery room in satisfactory condition having tolerated the  procedure well with sponge and instrument counts correct.   SPECIMENS TO PATHOLOGY:  Uterus and cervix.      Hal Morales, M.D.  Electronically Signed     VPH/MEDQ  D:  02/03/2005  T:  02/03/2005  Job:  [id number]

## 2010-10-25 NOTE — H&P (Signed)
[facility]  Patient:    Michelle Combs, Michelle Combs                       MRN: [id number] Proc. Date: 01/10/01 Adm. Date:  [id number] Attending:  Shaune Spittle Dictator:   Philipp Deputy, C.N.M.                         History and Physical  DATE OF BIRTH:                08-10-1970.  HISTORY:                      Michelle Combs is a 40 year old, gravida 4, para 1-0-2-1 at 39-3/7 weeks estimated gestational age who presents with uterine contractions every three to five minutes for several hours. She reports positive fetal movement and denies leaking or bleeding. She denies headache, nausea, and vomiting or visual disturbances. Her pregnancy has been followed by the [facility] MD service and has been remarkable for (1) history of two Abs, (2) history of abnormal Pap smear, (3) history of breast reduction, (4) positive group B strep with previous pregnancy, (5) first trimester spotting, and (6) desires postpartum tubal sterilization.  PRENATAL LABORATORY DATA:     Her prenatal labs were collected on June 20, 2000. Hemoglobin was 12.6, hematocrit 37.5, platelets 165,000. Blood type O positive, antibody negative, RPR nonreactive, rubella immune, hepatitis B surface antigen negative, gonorrhea negative, Chlamydia negative. One hour glucola was collected on Oct 19, 2000 and was 104. Her hemoglobin at that same time was 11.3.  HISTORY OF PRESENT PREGNANCY: She presented for prenatal care on June 08, 2000 at approximately 8 to [redacted] weeks gestation, and she was concerned regarding spotting she was having at that time. Pregnancy ultrasonography was performed on June 03, 2000 showing the fetus at 7 weeks 6 days with subchorionic hemorrhage. The last spotting she had was at [redacted] weeks gestation. Her ultrasound at 19 weeks was consistent with previous dating. At 27-1/2 weeks she presented with a bump on her right labia majora which was cultured for HSV and  culture was negative. Patient fell at approximately [redacted] weeks gestation and was evaluated at [facility] and by the time she presented to our office she was feeling better. The rest of her prenatal care was unremarkable.  OBSTETRICAL HISTORY:          She is a gravida 4, para 1-0-2-1. In September of 1998, at approximately [redacted] weeks gestation, she had a spontaneous Ab with a D&E. In December 1998, approximately [redacted] weeks gestation, she had a spontaneous Ab with D&E. In August of 2000 she vaginally delivered a female infant at [redacted] weeks gestation weighing 6 pounds 13 ounces after 12 hours in labor and an epidural for anesthesia. The infants name was Marlene Bast and delivery was remarkable for a nuchal cord and positive group B strep.  ALLERGIES:                    No known drug allergies.  PAST MEDICAL HISTORY:         She reports having had the usual childhood illnesses. She has used Ortho Novum 7/7/7 for contraception in the past. She had an abnormal Pap smear at age 51 with normal Pap smears since and a normal biopsy. She used Clomid with her third pregnancy to assist with conception. She occasionally has  yeast infection with oral contraceptives. She has had varicosities since the delivery of her first  child.  PAST SURGICAL HISTORY:        She had a breast reduction in 1998,  D&E x 2 in 1998.  GENETIC HISTORY:              The patients nephew was born with one kidney and no rectum secondary to epilepsy medications taken by his mother.  SOCIAL HISTORY:               She is married to [name] who is involved and supportive. Both are college educated and employed full-time. They deny any alcohol, tobacco, or drug use with pregnancy.  OBJECTIVE DATA: VITAL SIGNS:                  Stable, she is afebrile.  HEENT:                        Within normal limits.  CHEST:                        Clear to auscultation.  HEART:                        Regular to rate and rhythm.  ABDOMEN:                       Gravid in contour with uterine contractions every two to four minutes which are moderate. Fetal heart rate is reactive and reassuring.  PELVIC:                       Cervix is 3 to 4 cm, completely effaced, vertex, -2 with an intact bag of waters.  EXTREMITIES:                  Within normal limits.  ASSESSMENT:                   1. Intrauterine pregnancy at term.                               2. Early active labor.                               3. Group B streptococcus positive.  PLAN:                         1. Admit to birthing suite per consult with                                  Dr. Pennie Rushing.                               2. Routine M.D. orders.                               3. May place epidural p.r.n.  4. Penicillin G prophylaxis for group B                                  streptococcus. DD:  01/10/01 TD:  01/10/01 Job: [id number]

## 2010-10-25 NOTE — H&P (Signed)
Michelle Combs, Michelle Combs                ACCOUNT NO.:  [id number]   MEDICAL RECORD NO.:  [id number]          PATIENT TYPE:  INP   LOCATION:  NA                            FACILITY:  [facility]   PHYSICIAN:  Hal Morales, M.D.DATE OF BIRTH:  1971/03/30   DATE OF ADMISSION:  DATE OF DISCHARGE:                                HISTORY & PHYSICAL   HISTORY OF PRESENT ILLNESS:  Ms. Strauch is a 40 year old, married, white  female who is status post bilateral tubal ligation, para 2-0-2-2, who  presents for hysterectomy because of symptomatic endometriosis in the form  of severe pelvic pain, dyspareunia, and dysmenorrhea.  For the past two  years, the patient has had increasing dyspareunia and dysmenorrhea which she  rates the later as a 7/10 on a 10-point scale accompanied by a 10/10 back  pain.  Made worse with sitting.  In April 2005, the patient underwent a  diagnostic laparoscopy and was diagnosed with endometriosis.  Following that  time, she was placed on Ortho Evra patch for symptom relief, however,  experienced breakthrough bleeding and discontinued it.  The patient was then  placed on Provera 40 mg daily to which she responded positively and remained  pain free up until four months ago.  During the course of her Provera 40 mg  therapy, attempts were made to taper her dosage, however, any attempt  resulted in a recurrence of her symptoms.  The patient describes her  menstrual periods as being not heavy, lasting 3-5 days, requiring a change  of a pad every three hours but occasionally with clotting.  She denies any  vaginitis symptoms, urinary tract symptoms, or change in her bowel habits.  Over the past four months, the patient developed breakthrough bleeding in  spite of her 40 mg per day Provera dosage along with an increase in her  migraine headaches.  A pelvic ultrasound, in 2004, did not show any uterine  lesions nor other pelvic abnormalities.  Due to the failure of Provera and  the  patient's desire not to pursue other medical management options for her  endometriosis, the patient desires definitive therapy in the form of  hysterectomy.   PAST MEDICAL HISTORY:   OBSTETRICAL HISTORY:  1.  Gravida 4, para 2-0-2-2  2.  The patient had two spontaneous vaginal deliveries in 2000 and 2002.   GYNECOLOGIC HISTORY:  1.  Menarche 40 years old.  2.  The patient's last menstrual period was December 18, 2004.  3.  Menses is as described in history of present illness.  4.  She uses bilateral tubal ligation as her method of contraception.  5.  She has a remote history (age 79) of an abnormal Pap smear, however,      subsequent Pap smears have remained normal.  Her last normal Pap smear      was April 2006.  She denies any history of sexually transmitted      diseases.   MEDICAL HISTORY:  1.  Migraines.  2.  Gastroesophageal reflux disease.  3.  Anxiety.   SURGICAL HISTORY:  1.  In 1997, reduction  mammoplasty.  2.  In 1998 (September and December), D&E secondary to spontaneous abortion.  3.  In 2005, diagnostic laparoscopy - endometriosis.  The patient denies any      problems with anesthesia or a history of blood transfusions.   FAMILY HISTORY:  Positive for epilepsy, cancer, migraines, stroke, kidney  stones.   SOCIAL HISTORY:  The patient is married and she works at [employer].   HABITS:  She occasionally consumes alcohol.  Denies any tobacco use.   CURRENT MEDICATIONS:  1.  Prilosec OTC, two tablets daily.  2.  Imipramine 100 mg at bedtime.  3.  Provera 40 mg daily.  4.  Multivitamin one tablet daily.   The patient has no known drug allergies.   REVIEW OF SYSTEMS:  The patient does wear contact lenses.  History of rectal  bleeding with normal endoscopy/colonoscopy - December 2005.  Otherwise  except as mentioned in the history of present illness the patient's review  of systems is negative.   PHYSICAL EXAMINATION:  VITAL SIGNS:  Blood pressure  120/76, weight is 152,  height is 5 feet 2 inches tall.  NECK:  Supple without masses.  There is no thyromegaly or adenopathy.  HEART:  Regular rate and rhythm.  There is no murmur.  LUNGS:  Clear to auscultation.  There are no wheezes, rales, or rhonchi.  BACK:  No CVA tenderness.  ABDOMEN:  Bowel sounds are present.  It is soft without tenderness,  guarding, rebound, or organomegaly.  EXTREMITIES:  Without clubbing, cyanosis, or edema.  PELVIC:  EG/BUS is within normal limits.  Vagina is rugose.  Cervix is  nontender without lesions.  Uterus appears normal size, shape, and  consistency without tenderness.  Adnexa with no appreciable masses, however,  there is left adnexal tenderness.  Rectovaginal exam without masses.   IMPRESSION:  1.  Pelvic pain.  2.  Endometriosis.  3.  Rectal bleeding with negative workup.   DISPOSITION:  A discussion was held with the patient regarding the medical  and surgical options for management of her symptomatic endometriosis and she  has decided to proceed with hysterectomy.  A discussion was further had with  the patient regarding the possibility for persistent pain from her  endometriosis in spite of the hysterectomy procedure along with the  consequences of oophorectomy in a female less than 35-years-of-age.  The  patient received a review of the risks of her surgery which include, but are  not limited to, reaction to anesthesia, damage to adjacent organs,  infection, excessive bleeding, and continuance of her pelvic pain.  The  patient was given a copy of the ACOG brochure entitled, Understanding  Hysterectomy.  The patient has consented to proceed with laparoscopy and  total vaginal hysterectomy with the possibility of a total abdominal  hysterectomy and a possible bilateral salpingo-  oophorectomy.  The patient acknowledges that her ovaries will be preserved unless they are severely effected by endometriosis and is agreeable.  She is  scheduled  to undergo her hysterectomy procedure at [facility] on February 03, 2005 at 7:30 a.m.      [place] J. Adline Peals.      Hal Morales, M.D.  Electronically Signed    EJP/MEDQ  D:  01/28/2005  T:  01/28/2005  Job:  [id number]

## 2011-01-16 ENCOUNTER — Ambulatory Visit (HOSPITAL_COMMUNITY): Payer: Self-pay | Admitting: Psychiatry

## 2011-03-16 ENCOUNTER — Emergency Department (HOSPITAL_COMMUNITY)
Admission: EM | Admit: 2011-03-16 | Discharge: 2011-03-16 | Disposition: A | Discharge status: Home / Self Care | Payer: 59 | Attending: Emergency Medicine | Admitting: Emergency Medicine

## 2011-03-16 ENCOUNTER — Emergency Department (HOSPITAL_COMMUNITY): Payer: 59

## 2011-03-16 DIAGNOSIS — R221 Localized swelling, mass and lump, neck: Secondary | ICD-10-CM | POA: Insufficient documentation

## 2011-03-16 DIAGNOSIS — IMO0002 Reserved for concepts with insufficient information to code with codable children: Secondary | ICD-10-CM | POA: Insufficient documentation

## 2011-03-16 DIAGNOSIS — R220 Localized swelling, mass and lump, head: Secondary | ICD-10-CM | POA: Insufficient documentation

## 2011-03-16 DIAGNOSIS — S0292XA Unspecified fracture of facial bones, initial encounter for closed fracture: Secondary | ICD-10-CM

## 2011-03-16 DIAGNOSIS — R51 Headache: Secondary | ICD-10-CM | POA: Insufficient documentation

## 2011-03-16 DIAGNOSIS — R599 Enlarged lymph nodes, unspecified: Secondary | ICD-10-CM | POA: Insufficient documentation

## 2011-03-16 HISTORY — DX: Depression, unspecified: F32.A

## 2011-03-16 HISTORY — DX: Major depressive disorder, single episode, unspecified: F32.9

## 2011-03-16 LAB — URINALYSIS, ROUTINE W REFLEX MICROSCOPIC
Ketones, ur: NEGATIVE mg/dL
Leukocytes, UA: NEGATIVE
Nitrite: NEGATIVE
Specific Gravity, Urine: 1.025 (ref 1.005–1.030)
pH: 5 (ref 5.0–8.0)

## 2011-03-16 LAB — URINE MICROSCOPIC-ADD ON

## 2011-03-16 LAB — PREGNANCY, URINE: Preg Test, Ur: NEGATIVE

## 2011-03-16 MED ORDER — IBUPROFEN 800 MG PO TABS
800.0000 mg | ORAL_TABLET | Freq: Once | ORAL | Status: AC
  Administered 2011-03-16: 800 mg via ORAL
  Filled 2011-03-16: qty 1

## 2011-03-16 MED ORDER — HYDROCODONE-ACETAMINOPHEN 5-325 MG PO TABS
2.0000 | ORAL_TABLET | Freq: Once | ORAL | Status: AC
  Administered 2011-03-16: 2 via ORAL
  Filled 2011-03-16: qty 2

## 2011-03-16 MED ORDER — HYDROCODONE-ACETAMINOPHEN 5-325 MG PO TABS
1.0000 | ORAL_TABLET | ORAL | Status: AC | PRN
Start: 2011-03-16 — End: 2011-03-26

## 2011-03-16 NOTE — ED Notes (Signed)
Pt called nurse sec.  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  Will give work note for 2 days and advised will be left at front desk for her to pick up.
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
## 2012-02-12 ENCOUNTER — Ambulatory Visit (INDEPENDENT_AMBULATORY_CARE_PROVIDER_SITE_OTHER): Payer: 59 | Admitting: Obstetrics and Gynecology

## 2012-02-12 ENCOUNTER — Encounter: Payer: Self-pay | Admitting: Obstetrics and Gynecology

## 2012-02-12 VITALS — BP 104/70 | HR 78 | Temp 98.1°F | Resp 14 | Ht 62.0 in | Wt 127.0 lb

## 2012-02-12 DIAGNOSIS — Z113 Encounter for screening for infections with a predominantly sexual mode of transmission: Secondary | ICD-10-CM

## 2012-02-12 DIAGNOSIS — Z01419 Encounter for gynecological examination (general) (routine) without abnormal findings: Secondary | ICD-10-CM

## 2012-02-12 DIAGNOSIS — R102 Pelvic and perineal pain: Secondary | ICD-10-CM

## 2012-02-12 DIAGNOSIS — N949 Unspecified condition associated with female genital organs and menstrual cycle: Secondary | ICD-10-CM

## 2012-02-12 DIAGNOSIS — N898 Other specified noninflammatory disorders of vagina: Secondary | ICD-10-CM

## 2012-02-12 LAB — POCT WET PREP (WET MOUNT)
Whiff Test: NEGATIVE
pH: 4.5

## 2012-02-12 LAB — POCT URINALYSIS DIPSTICK
Glucose, UA: NEGATIVE
Ketones, UA: NEGATIVE
Leukocytes, UA: NEGATIVE
Protein, UA: NEGATIVE

## 2012-02-12 LAB — POCT OSOM TRICHOMONAS RAPID TEST: Trichomonas vaginalis: NEGATIVE

## 2012-02-12 MED ORDER — CLOTRIMAZOLE-BETAMETHASONE 1-0.05 % EX CREA: TOPICAL_CREAM | CUTANEOUS | Status: DC

## 2012-02-12 NOTE — Progress Notes (Signed)
Subjective:    Michelle Combs is a 41 y.o. female, G4P2, who presents for an annual exam. The patient reports early awakening (3 a.m. or 5 a.m.), Patient also complains of discomfort with sitting and intercourse because of elongated labia.  Would like to have that repaired.  Patient's divorce will be final in 5 weeks thus she would like STD testing.  Has a longstanding history of frequent yeast infections and currently has some external itching at the apex of labia.   Menstrual cycle:   LMP: No LMP recorded. Patient has had a hysterectomy.             Review of Systems Pertinent items are noted in HPI. Denies pelvic pain, urinary tract symptoms, vaginitis symptoms, irregular bleeding, menopausal symptoms, change in bowel habits or rectal bleeding   Objective:    BP 104/70  Pulse 78  Temp 98.1 F (36.7 C) (Oral)  Resp 14  Ht 5\' 2"  (1.575 m)  Wt 127 lb (57.607 kg)  BMI 23.23 kg/m2    Wt Readings from Last 1 Encounters:  02/12/12 127 lb (57.607 kg)   Body mass index is 23.23 kg/(m^2). General Appearance: Alert, no acute distress HEENT: Grossly normal Neck / Thyroid: Supple, no thyromegaly or cervical adenopathy Lungs: Clear to auscultation bilaterally Back: No CVA tenderness Breast Exam: No masses or nodes.No dimpling, nipple retraction or discharge. Cardiovascular: Regular rate and rhythm.  Gastrointestinal: Soft, non-tender, no masses or organomegaly Pelvic Exam: EGBUS-wnl, with no skin changes at labial apex where patient complains of itchiness;  labia minora observed to extend beyond border of labia majora;  vagina-normal rugae, cervix//uterus-surgically absent adnexae-no tenderness Lymphatic Exam: Non-palpable nodes in neck, clavicular,  axillary, or inguinal regions  Skin: no rashes or abnormalities Extremities: no clubbing cyanosis or edema  Neurologic: grossly normal Psychiatric: Alert and oriented  Wet Prep:  pH-4.5, whiff-negative, neegative clue, yeast or trichomonas    OSOM Trich: negative   Assessment:   Routine GYN Exam Vulvitis Symptomatic Prominence of Labia Minora Early morning awakening Plan:   1. Sleep hygiene  2. Patient given revised guidelines for PAP smears and hysterectomy  3. Patient wants to pursue  labioplasty with Dr. Pennie Rushing  4.  Lotrisone 15 g apply to affected area bid x 7 days   5.  STD testing  6.  RTO 1 year or prn  Berta Denson,ELMIRAPA-C

## 2012-02-12 NOTE — Patient Instructions (Addendum)
To develop good sleep habits:    Go to bed and get up  at the same time each day (even on your days off)  Avoid caffeine, alcohol or nicotine at least 4-6 hours before bedtime  If you haven't fallen asleep within 15 minutes of getting in the bed, get up and do something non-stimulating  until you feel sleepy again,  then return to bed.  Only try to sleep when you are actually sleepy.  Do not watch TV, read, write, play games or talk on the phone in bed.  Only use the bed for sleep and sex  Do not nap or remain  in the bed if you are awake  Do not go to bed too  hungry or  too full   Develop a routine prior to bedtime so that your body will get a signal that bedtime is near  Do not do anything stimulating before bedtime  Make sure that your bedroom is comfortable for sleeping   

## 2012-02-12 NOTE — Progress Notes (Signed)
Regular Periods: no Mammogram: no  Monthly Breast Ex.: no Exercise: no  Tetanus < 10 years: yes Seatbelts: yes  NI. Bladder Functn.: yes Abuse at home: no  Daily BM's: yes Stressful Work: no  Healthy Diet: yes Sigmoid-Colonoscopy: "7-8 years ago WNL" per Dr.Martin [employer].  Calcium: no Medical problems this year: pt c/o lack of sleep. Pt denies moodiness or hot flashes. Pt c/o vaginal irritation on the outside of the vagina.   LAST PAP:10/2006  Contraception: NONE/HYST  Mammogram:  Never  PCP: Ma Hillock, [place]  PMH: No Changes  FMH: No Changes  Last Bone Scan: Never    Odor: no Fever: no Pelvic Pain: yes  Itching: yes Dyspareunia: no Desires GC/CT: yes  Thin: yes History of PID: yes Desires HIV,RPR,HbsAG: yes  Thick: no History of STD: no Other: N/A

## 2012-02-13 LAB — HIV ANTIBODY (ROUTINE TESTING W REFLEX): HIV: NONREACTIVE

## 2012-02-16 ENCOUNTER — Telehealth: Payer: Self-pay | Admitting: Obstetrics and Gynecology

## 2012-02-16 NOTE — Telephone Encounter (Signed)
TRIAGE/ TST RES °

## 2012-02-16 NOTE — Telephone Encounter (Signed)
Spoke with pt rgd msg informed labs wnl pt voice understanding 

## 2012-02-16 NOTE — Telephone Encounter (Signed)
Lm on vm tcb rgd msg 

## 2012-02-25 ENCOUNTER — Telehealth: Payer: Self-pay | Admitting: Obstetrics and Gynecology

## 2012-02-25 NOTE — Telephone Encounter (Signed)
Needs appt with me

## 2012-02-25 NOTE — Telephone Encounter (Signed)
[facility] to address

## 2012-02-25 NOTE — Telephone Encounter (Signed)
CHANDRA/SURG/QUES

## 2012-02-25 NOTE — Telephone Encounter (Signed)
Lm on vm to cb per vph recs.

## 2012-02-26 NOTE — Telephone Encounter (Signed)
Tc from pt per telephone call. Appt sched 03/09/12 with [facility] to discuss desire for surgery. Pt agrees.

## 2012-03-09 ENCOUNTER — Encounter: Payer: Self-pay | Admitting: Obstetrics and Gynecology

## 2012-03-09 ENCOUNTER — Ambulatory Visit (INDEPENDENT_AMBULATORY_CARE_PROVIDER_SITE_OTHER): Payer: 59 | Admitting: Obstetrics and Gynecology

## 2012-03-09 VITALS — BP 102/60 | Ht 62.0 in | Wt 128.0 lb

## 2012-03-09 DIAGNOSIS — N906 Unspecified hypertrophy of vulva: Secondary | ICD-10-CM

## 2012-03-09 NOTE — Progress Notes (Signed)
GYN PROBLEM VISIT  [name] is a 41 y.o. year old female,G4P2, who presents for a problem visit.   Subjective:  Pt is here to discuss labioplasty.  She gained over 50 lbs with each of her pregnancies and has since lost that weight.  She now notices a laxity to the labia greater on the left than on the right.  She has had a problem with chronic yeast infections and is concerned that these may be due to the difficulty with perineal hygeine.  She states that area "is just in the way"    Objective:  Ht 5\' 2"  (1.575 m)  Wt 128 lb (58.06 kg)  BMI 23.41 kg/m2     External genitalia: fat pad loss and left labial hypertrophy measuring 4 cm in width  Assessment:  Symptomatic labial hypertrophy  Plan: I used a mirror to map out with the patient the planned resection, purposely leaving some of the labia because of its proximity to the clitoris. A long discussion was held with the pt concerning the pros and cons of labioplasty.  The risks of anesthesia, bleeding , infection and damage to adjacent organs were reviewed.  The specific risk of post operative neuropathy was reviewed in detail because of the proximity of this area to the clitoris.  i explained that the changes in nerve function could span the entire spectrum from numbness to hypersensitivity and phantom pain, all of which can be difficult to treat.  She says she understands and feels the benefit of improved hygeine and comfort were worth taking those risks. I also reviewed that insurance company may decline to cover this elective procedure.  She requests that it be scheduled.   Dierdre Forth, MD  03/09/2012 4:25 PM

## 2012-03-12 ENCOUNTER — Encounter (HOSPITAL_COMMUNITY): Payer: Self-pay | Admitting: *Deleted

## 2012-03-15 ENCOUNTER — Telehealth: Payer: Self-pay | Admitting: Obstetrics and Gynecology

## 2012-03-15 NOTE — Telephone Encounter (Signed)
[facility] pt 

## 2012-03-15 NOTE — Telephone Encounter (Signed)
PT HAD LABIAPLASTY ON 03-17-12 AND PT STATES THAT SHE TALKED TO [facility] CONCERNING BEING OUT FOR 1-2 WEEKS.PT STATES THAT AT THE TIME SHE WAS THINKING TO BE OUT FOR 1 WEEK FOR HEALING BUT PT FOUND OUT THAT IN ORDER TO GET PAID  DISABILITY;PT NEED TO BE OUT FOR 2 WEEKS THEREFORE PT NEEDS AN NOTE FOR 2 WEEKS FROM [facility]. INFORMED PT THAT I WILL GIVE [facility] THE MESSAGE AND HER SOMEONE WILL GIVE HER A CALL BACK. PT VOICED UNDERSTANDING.

## 2012-03-16 ENCOUNTER — Other Ambulatory Visit: Payer: Self-pay | Admitting: Obstetrics and Gynecology
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
## 2012-03-16 NOTE — Telephone Encounter (Signed)
OK for note to be out for 2 weeks.  This is the amt of time I recommended initially.

## 2012-03-17 ENCOUNTER — Encounter (HOSPITAL_COMMUNITY)
Admission: RE | Disposition: A | Discharge status: Home / Self Care | Payer: Self-pay | Source: Ambulatory Visit | Attending: Obstetrics and Gynecology

## 2012-03-17 ENCOUNTER — Ambulatory Visit (HOSPITAL_COMMUNITY)
Admission: RE | Admit: 2012-03-17 | Discharge: 2012-03-17 | Disposition: A | Discharge status: Home / Self Care | Payer: 59 | Source: Ambulatory Visit | Attending: Obstetrics and Gynecology | Admitting: Obstetrics and Gynecology

## 2012-03-17 ENCOUNTER — Encounter (HOSPITAL_COMMUNITY): Payer: Self-pay | Admitting: Obstetrics and Gynecology

## 2012-03-17 ENCOUNTER — Encounter (HOSPITAL_COMMUNITY): Payer: Self-pay | Admitting: Anesthesiology

## 2012-03-17 ENCOUNTER — Ambulatory Visit (HOSPITAL_COMMUNITY): Payer: 59 | Admitting: Anesthesiology

## 2012-03-17 DIAGNOSIS — N906 Unspecified hypertrophy of vulva: Secondary | ICD-10-CM | POA: Insufficient documentation

## 2012-03-17 DIAGNOSIS — B373 Candidiasis of vulva and vagina: Secondary | ICD-10-CM

## 2012-03-17 HISTORY — PX: LABIOPLASTY: SHX1900

## 2012-03-17 HISTORY — DX: Cardiac murmur, unspecified: R01.1

## 2012-03-17 HISTORY — DX: Other specified postprocedural states: Z98.890

## 2012-03-17 HISTORY — DX: Other specified postprocedural states: R11.2

## 2012-03-17 LAB — CBC
HCT: 38.8 % (ref 36.0–46.0)
Hemoglobin: 13 g/dL (ref 12.0–15.0)
MCV: 98 fL (ref 78.0–100.0)
Platelets: 172 10*3/uL (ref 150–400)
RBC: 3.96 MIL/uL (ref 3.87–5.11)
WBC: 5.9 10*3/uL (ref 4.0–10.5)

## 2012-03-17 SURGERY — LABIAPLASTY, VULVA
Anesthesia: General | Site: Perineum | Laterality: Bilateral | Wound class: Clean Contaminated

## 2012-03-17 MED ORDER — LIDOCAINE HCL (CARDIAC) 20 MG/ML IV SOLN
INTRAVENOUS | Status: AC
  Filled 2012-03-17: qty 5

## 2012-03-17 MED ORDER — BUPIVACAINE HCL (PF) 0.25 % IJ SOLN
INTRAMUSCULAR | Status: DC | PRN
  Administered 2012-03-17: 1 mL

## 2012-03-17 MED ORDER — LACTATED RINGERS IV SOLN
INTRAVENOUS | Status: DC
  Administered 2012-03-17 (×2): via INTRAVENOUS

## 2012-03-17 MED ORDER — LIDOCAINE HCL 2 % IJ SOLN
INTRAMUSCULAR | Status: DC | PRN
  Administered 2012-03-17: 1 mL

## 2012-03-17 MED ORDER — PROPOFOL 10 MG/ML IV EMUL
INTRAVENOUS | Status: DC | PRN
  Administered 2012-03-17: 200 mg via INTRAVENOUS

## 2012-03-17 MED ORDER — SCOPOLAMINE 1 MG/3DAYS TD PT72
MEDICATED_PATCH | TRANSDERMAL | Status: AC
  Administered 2012-03-17: 1.5 mg via TRANSDERMAL
  Filled 2012-03-17: qty 1

## 2012-03-17 MED ORDER — FENTANYL CITRATE 0.05 MG/ML IJ SOLN
25.0000 ug | INTRAMUSCULAR | Status: DC | PRN
  Administered 2012-03-17 (×2): 50 ug via INTRAVENOUS

## 2012-03-17 MED ORDER — ONDANSETRON HCL 4 MG/2ML IJ SOLN
INTRAMUSCULAR | Status: AC
  Filled 2012-03-17: qty 2

## 2012-03-17 MED ORDER — KETOROLAC TROMETHAMINE 30 MG/ML IJ SOLN
INTRAMUSCULAR | Status: AC
  Filled 2012-03-17: qty 1

## 2012-03-17 MED ORDER — SCOPOLAMINE 1 MG/3DAYS TD PT72
1.0000 | MEDICATED_PATCH | Freq: Once | TRANSDERMAL | Status: DC
  Administered 2012-03-17: 1.5 mg via TRANSDERMAL

## 2012-03-17 MED ORDER — FENTANYL CITRATE 0.05 MG/ML IJ SOLN
INTRAMUSCULAR | Status: AC
  Filled 2012-03-17: qty 5

## 2012-03-17 MED ORDER — DEXAMETHASONE SODIUM PHOSPHATE 4 MG/ML IJ SOLN
INTRAMUSCULAR | Status: DC | PRN
  Administered 2012-03-17: 10 mg via INTRAVENOUS

## 2012-03-17 MED ORDER — DEXAMETHASONE SODIUM PHOSPHATE 10 MG/ML IJ SOLN
INTRAMUSCULAR | Status: AC
  Filled 2012-03-17: qty 1

## 2012-03-17 MED ORDER — PROPOFOL 10 MG/ML IV EMUL
INTRAVENOUS | Status: AC
  Filled 2012-03-17: qty 20

## 2012-03-17 MED ORDER — FENTANYL CITRATE 0.05 MG/ML IJ SOLN
INTRAMUSCULAR | Status: AC
  Filled 2012-03-17: qty 2

## 2012-03-17 MED ORDER — HYDROMORPHONE HCL PF 1 MG/ML IJ SOLN
INTRAMUSCULAR | Status: AC
  Filled 2012-03-17: qty 1

## 2012-03-17 MED ORDER — KETOROLAC TROMETHAMINE 30 MG/ML IJ SOLN
INTRAMUSCULAR | Status: DC | PRN
  Administered 2012-03-17: 30 mg via INTRAVENOUS

## 2012-03-17 MED ORDER — IBUPROFEN 600 MG PO TABS: 600.0000 mg | ORAL_TABLET | Freq: Four times a day (QID) | ORAL | Status: DC | PRN

## 2012-03-17 MED ORDER — MEPERIDINE HCL 25 MG/ML IJ SOLN: 6.2500 mg | INTRAMUSCULAR | Status: DC | PRN

## 2012-03-17 MED ORDER — MIDAZOLAM HCL 5 MG/5ML IJ SOLN
INTRAMUSCULAR | Status: DC | PRN
  Administered 2012-03-17: 2 mg via INTRAVENOUS

## 2012-03-17 MED ORDER — LIDOCAINE HCL 2 % IJ SOLN
INTRAMUSCULAR | Status: AC
  Filled 2012-03-17: qty 20

## 2012-03-17 MED ORDER — FENTANYL CITRATE 0.05 MG/ML IJ SOLN
INTRAMUSCULAR | Status: DC | PRN
  Administered 2012-03-17 (×3): 100 ug via INTRAVENOUS
  Administered 2012-03-17: 50 ug via INTRAVENOUS
  Administered 2012-03-17: 100 ug via INTRAVENOUS
  Administered 2012-03-17: 50 ug via INTRAVENOUS

## 2012-03-17 MED ORDER — MIDAZOLAM HCL 2 MG/2ML IJ SOLN
INTRAMUSCULAR | Status: AC
  Filled 2012-03-17: qty 2

## 2012-03-17 MED ORDER — BUPIVACAINE HCL (PF) 0.25 % IJ SOLN
INTRAMUSCULAR | Status: AC
  Filled 2012-03-17: qty 30

## 2012-03-17 MED ORDER — HYDROMORPHONE HCL PF 1 MG/ML IJ SOLN
INTRAMUSCULAR | Status: DC | PRN
  Administered 2012-03-17: 1 mg via INTRAVENOUS

## 2012-03-17 MED ORDER — OXYCODONE-ACETAMINOPHEN 5-325 MG PO TABS: 1.0000 | ORAL_TABLET | ORAL | Status: DC | PRN

## 2012-03-17 MED ORDER — LIDOCAINE HCL (CARDIAC) 20 MG/ML IV SOLN
INTRAVENOUS | Status: DC | PRN
  Administered 2012-03-17: 100 mg via INTRAVENOUS

## 2012-03-17 MED ORDER — KETOROLAC TROMETHAMINE 30 MG/ML IJ SOLN: 15.0000 mg | Freq: Once | INTRAMUSCULAR | Status: DC | PRN

## 2012-03-17 MED ORDER — ONDANSETRON HCL 4 MG/2ML IJ SOLN
4.0000 mg | Freq: Once | INTRAMUSCULAR | Status: AC | PRN
  Administered 2012-03-17: 4 mg via INTRAVENOUS

## 2012-03-17 MED ORDER — ONDANSETRON HCL 4 MG/2ML IJ SOLN
INTRAMUSCULAR | Status: DC | PRN
  Administered 2012-03-17: 4 mg via INTRAVENOUS

## 2012-03-17 SURGICAL SUPPLY — 27 items
BLADE SURG 15 STRL LF C SS BP (BLADE) ×1 IMPLANT
BLADE SURG 15 STRL SS (BLADE) ×2
CATH ROBINSON RED A/P 16FR (CATHETERS) ×1 IMPLANT
CLOTH BEACON ORANGE TIMEOUT ST (SAFETY) ×2 IMPLANT
COUNTER NEEDLE 1200 MAGNETIC (NEEDLE) ×1 IMPLANT
DECANTER SPIKE VIAL GLASS SM (MISCELLANEOUS) ×1 IMPLANT
ELECT NDL TIP 2.8 STRL (NEEDLE) IMPLANT
ELECT NEEDLE TIP 2.8 STRL (NEEDLE) ×2 IMPLANT
ELECT REM PT RETURN 9FT ADLT (ELECTROSURGICAL) ×2
ELECTRODE REM PT RTRN 9FT ADLT (ELECTROSURGICAL) ×1 IMPLANT
GLOVE BIO SURGEON STRL SZ 6.5 (GLOVE) ×3 IMPLANT
GLOVE BIOGEL PI IND STRL 7.0 (GLOVE) ×2 IMPLANT
GLOVE BIOGEL PI INDICATOR 7.0 (GLOVE) ×6
GOWN PREVENTION PLUS LG XLONG (DISPOSABLE) ×4 IMPLANT
GOWN STRL REIN 2XL XLG LVL4 (GOWN DISPOSABLE) ×2 IMPLANT
NDL HYPO 25X1 1.5 SAFETY (NEEDLE) IMPLANT
NDL SPNL 22GX3.5 QUINCKE BK (NEEDLE) IMPLANT
NEEDLE HYPO 25X1 1.5 SAFETY (NEEDLE) ×2 IMPLANT
NEEDLE SPNL 22GX3.5 QUINCKE BK (NEEDLE) ×2 IMPLANT
PACK VAGINAL MINOR WOMEN LF (CUSTOM PROCEDURE TRAY) ×2 IMPLANT
PENCIL BUTTON HOLSTER BLD 10FT (ELECTRODE) ×1 IMPLANT
SUT VIC AB 3-0 SH 27 (SUTURE)
SUT VIC AB 3-0 SH 27XBRD (SUTURE) IMPLANT
SUT VICRYL RAPIDE 3-0 36IN (SUTURE) ×1 IMPLANT
SYR CONTROL 10ML LL (SYRINGE) ×1 IMPLANT
TOWEL OR 17X24 6PK STRL BLUE (TOWEL DISPOSABLE) ×4 IMPLANT
WATER STERILE IRR 1000ML POUR (IV SOLUTION) ×2 IMPLANT

## 2012-03-17 NOTE — Anesthesia Preprocedure Evaluation (Addendum)
Anesthesia Evaluation  Patient identified by MRN, date of birth, ID band Patient awake    Reviewed: Allergy & Precautions, H&P , NPO status , Patient's Chart, lab work & pertinent test results  Airway Mallampati: I TM Distance: >3 FB Neck ROM: full    Dental No notable dental hx. (+) Teeth Intact   Pulmonary neg pulmonary ROS,    Pulmonary exam normal       Cardiovascular negative cardio ROS      Neuro/Psych Anxiety Depression negative neurological ROS     GI/Hepatic Neg liver ROS,   Endo/Other  negative endocrine ROS  Renal/GU negative Renal ROS  negative genitourinary   Musculoskeletal negative musculoskeletal ROS (+)   Abdominal Normal abdominal exam  (+)   Peds negative pediatric ROS (+)  Hematology negative hematology ROS (+)   Anesthesia Other Findings   Reproductive/Obstetrics negative OB ROS                          Anesthesia Physical Anesthesia Plan  ASA: II  Anesthesia Plan: General   Post-op Pain Management:    Induction: Intravenous  Airway Management Planned: LMA  Additional Equipment:   Intra-op Plan:   Post-operative Plan:   Informed Consent: I have reviewed the patients History and Physical, chart, labs and discussed the procedure including the risks, benefits and alternatives for the proposed anesthesia with the patient or authorized representative who has indicated his/her understanding and acceptance.     Plan Discussed with: CRNA and Surgeon  Anesthesia Plan Comments:        Anesthesia Quick Evaluation

## 2012-03-17 NOTE — Anesthesia Postprocedure Evaluation (Signed)
Anesthesia Post Note  Patient: Michelle Combs  Procedure(s) Performed: Procedure(s) (LRB): LABIAPLASTY (Bilateral)  Anesthesia type: GA  Patient location: PACU  Post pain: Pain level controlled  Post assessment: Post-op Vital signs reviewed  Last Vitals:  Filed Vitals:   03/17/12 1230  BP: 131/64  Pulse: 67  Temp: 36.5 C  Resp: 16    Post vital signs: Reviewed  Level of consciousness: sedated  Complications: No apparent anesthesia complications

## 2012-03-17 NOTE — Transfer of Care (Signed)
Immediate Anesthesia Transfer of Care Note  Patient: Michelle Combs  Procedure(s) Performed: Procedure(s) (LRB) with comments: LABIAPLASTY (Bilateral)  Patient Location: PACU  Anesthesia Type: General  Level of Consciousness: awake, alert  and oriented  Airway & Oxygen Therapy: Patient Spontanous Breathing and Patient connected to nasal cannula oxygen  Post-op Assessment: Report given to PACU RN and Post -op Vital signs reviewed and stable  Post vital signs: stable  Complications: No apparent anesthesia complications

## 2012-03-17 NOTE — Telephone Encounter (Signed)
Will forward to Denny Peon for letter.

## 2012-03-17 NOTE — Op Note (Signed)
Procedure(s): LABIAPLASTY Procedure Note  Michelle Combs female 41 y.o. 03/17/2012  Procedure(s) and Anesthesia Type:    * LABIAPLASTY - General  Surgeon(s) and Role:    * Hal Morales, MD - Primary   Indications: Labial hypertrophy        Surgeon: Hal Morales   Assistants: Marita Snellen certified physician Asst.  Anesthesia: General endotracheal anesthesia  ASA Class: 2    Procedure Detail  LABIAPLASTY The patient was taken to the operating room after appropriate identification placed on the operating table. After the attainment of adequate general anesthesia she was placed in the lithotomy position. The perineum and vagina were prepped with multiple layers of Betadine and draped as a sterile field after a red Robinson catheter was used to empty the bladder. The labia were evaluated bilaterally and marked with a marking pin to outline the excesses on either side care was taken to use a as far away from the clitoral region as possible. The incision line was then infiltrated with a combination of quarter percent Marcaine and 2% Xylocaine in a 50-50 combination. On the left side and the labia was excised along the.midline and the submucosal area made hemostatic with Bovie cautery. The incision line was then closed with interrupted subcuticular sutures of 3-0 Vicryl. A similar procedure was carried out on the opposite side following the marked line as the area of excision and closing the incision line with subcuticular interrupted sutures of 3-0 Vicryl. Hemostasis was noted to be adequate. The patient was awakened from general anesthesia and taken to the recovery room in satisfactory condition having tolerated the procedure well with sponge and instrument counts correct. Toradol 30 mg IV and 30 mg IM were given prior to emergence from anesthesia. Ice packs were placed on the perineum on admission to the PACU  Findings: The labia minora were elongations bilaterally greater on  the left than on the right. The left labia overlapped the right side by approximately 4 cm. The right labia overlapped the midline by approximately 2 cm. There were no specific lesions either side of the labia minora]  Estimated Blood Loss:  Minimal         Drains: None         Total IV Fluids:   Blood Given: none          Specimens: Bilateral labial excision Disposition:  Discarded         Implants: none        Complications:  None         Disposition: PACU - hemodynamically stable.         Condition: stable

## 2012-03-17 NOTE — H&P (Signed)
  History and Physical Interval Note:   03/17/2012   12:44 PM   Michelle Combs  has presented today for surgery, with the diagnosis of Labial Hypertrophy  The various methods of treatment have been discussed with the patient and family. After consideration of risks, benefits and other options for treatment, the patient has consented to  Procedure(s):Bilateral  LABIAPLASTY as a surgical intervention .  I have examined the patient, reviewed the patients' chart and labs.  Questions were answered to the patient's satisfaction.     Hal Morales  MD

## 2012-03-18 ENCOUNTER — Encounter (HOSPITAL_COMMUNITY): Payer: Self-pay | Admitting: Obstetrics and Gynecology

## 2012-03-19 ENCOUNTER — Telehealth: Payer: Self-pay | Admitting: Obstetrics and Gynecology

## 2012-03-19 NOTE — Telephone Encounter (Signed)
Tc to pt regarding msg below, pt says area where stitches are located are itching and burning and is still having some bldg, but is not bldg heavily.  Pt ?'s what she can do for some relief.  Also wants to know if ok to wash bottom, says was told to only use Vaseline and do not wash the area.Per EP advised pt can wash bottom but use plain water and do not use a wash cloth, but with hands only.  Pt also advised can sit in a tub of cool water without anything in it or she may add a sitz bath to the water, do not use any steroid cream as it will slow down the healing process (pt says she had some @ home).  Pt to call office if no relief or if any concerns, pt voices agreement.

## 2012-03-22 ENCOUNTER — Telehealth: Payer: Self-pay | Admitting: Obstetrics and Gynecology

## 2012-03-22 MED ORDER — NYSTATIN-TRIAMCINOLONE 100000-0.1 UNIT/GM-% EX OINT: TOPICAL_OINTMENT | Freq: Two times a day (BID) | CUTANEOUS | Status: DC

## 2012-03-22 MED ORDER — FLUCONAZOLE 150 MG PO TABS: ORAL_TABLET | ORAL | Status: DC

## 2012-03-22 NOTE — Telephone Encounter (Signed)
Pt thinks she has yeast infection. C/o thick and white d/c with "funny" odor.  6 days post-op labiaplasty.  States it is not the stitches that are bothering her. Having a lot of burning and itching.  No pain.  Will forward message to [facility] for review.  ld

## 2012-03-22 NOTE — Telephone Encounter (Signed)
Pt notified of [facility] recommendations.  Prescriptions sent to pharmacy.  Pt to call if no better. Pt agreeable.  ld

## 2012-03-22 NOTE — Telephone Encounter (Signed)
Recommend pt continue with cleaning and drying regimen. Send to pharmacy: Diflucan 150 mg po Stat.  Repeat once in two days.  Disp #2.  0 refills    Mycolog ointment .  Use sparingly to affected area BID for a maximum of 7 days.  Dip #30 gm.  0 refills

## 2012-03-30 ENCOUNTER — Encounter: Payer: Self-pay | Admitting: Obstetrics and Gynecology

## 2012-03-30 ENCOUNTER — Ambulatory Visit: Payer: 59 | Admitting: Obstetrics and Gynecology

## 2012-03-30 VITALS — BP 122/62 | HR 72 | Temp 98.4°F | Resp 16 | Ht 62.0 in | Wt 125.0 lb

## 2012-03-30 DIAGNOSIS — N906 Unspecified hypertrophy of vulva: Secondary | ICD-10-CM

## 2012-03-30 NOTE — Progress Notes (Signed)
POST-OP:  Subjective:     Michelle Combs is a 42 y.o. female who presents for post-op visit.  DATE OF SURGERY: 03/17/2012 TYPE OF SURGERY: Labioplasty PAIN:Yes / Burning VAG BLEEDING: no VAG DISCHARGE: yes NORMAL GI FUNCTN: yes NORMAL GU FUNCTN: yes  Pathology report: N/a   The following portions of the patient's history were reviewed and updated as appropriate: allergies, current medications, past family history, past medical history, past social history, past surgical history and problem list.  Review of Systems Pertinent items are noted in HPI.   Objective:    BP 122/62  Pulse 72  Temp 98.4 F (36.9 C) (Oral)  Resp 16  Ht 5\' 2"  (1.575 m)  Wt 125 lb (56.7 kg)  BMI 22.86 kg/m2 Weight:  Wt Readings from Last 1 Encounters:  03/30/12 125 lb (56.7 kg)    BMI: Body mass index is 22.86 kg/(m^2).  General Appearance: Alert, appropriate appearance for age. No acute distress  Incision/s: healing well.  Edges well approximated.  No discharge or erythema      Assessment:    Doing well postoperatively. Operative findings again reviewed.   Plan:   RTW RTO 3 mos Delav intercourse for 6 weeks post op  Dierdre Forth MD 10/22/20131:58 PM

## 2012-04-12 ENCOUNTER — Telehealth: Payer: Self-pay | Admitting: Obstetrics and Gynecology

## 2012-04-15 ENCOUNTER — Telehealth: Payer: Self-pay | Admitting: Obstetrics and Gynecology

## 2012-04-16 ENCOUNTER — Telehealth: Payer: Self-pay | Admitting: Obstetrics and Gynecology

## 2012-04-16 NOTE — Telephone Encounter (Signed)
Routed to [place] rgd pt .

## 2012-04-16 NOTE — Telephone Encounter (Signed)
[facility] aware.

## 2013-06-06 ENCOUNTER — Other Ambulatory Visit: Payer: Self-pay | Admitting: Dermatology

## 2014-04-10 ENCOUNTER — Encounter: Payer: Self-pay | Admitting: Obstetrics and Gynecology

## 2015-03-09 ENCOUNTER — Emergency Department (HOSPITAL_COMMUNITY)
Admission: EM | Admit: 2015-03-09 | Discharge: 2015-03-09 | Disposition: A | Discharge status: Home / Self Care | Payer: Commercial Managed Care - HMO | Attending: Emergency Medicine | Admitting: Emergency Medicine

## 2015-03-09 ENCOUNTER — Encounter (HOSPITAL_COMMUNITY): Payer: Self-pay | Admitting: Family Medicine

## 2015-03-09 DIAGNOSIS — Z8742 Personal history of other diseases of the female genital tract: Secondary | ICD-10-CM | POA: Diagnosis not present

## 2015-03-09 DIAGNOSIS — F329 Major depressive disorder, single episode, unspecified: Secondary | ICD-10-CM | POA: Insufficient documentation

## 2015-03-09 DIAGNOSIS — T490X4A Poisoning by local antifungal, anti-infective and anti-inflammatory drugs, undetermined, initial encounter: Secondary | ICD-10-CM | POA: Insufficient documentation

## 2015-03-09 DIAGNOSIS — Z8719 Personal history of other diseases of the digestive system: Secondary | ICD-10-CM | POA: Diagnosis not present

## 2015-03-09 DIAGNOSIS — R21 Rash and other nonspecific skin eruption: Secondary | ICD-10-CM | POA: Insufficient documentation

## 2015-03-09 DIAGNOSIS — Z8619 Personal history of other infectious and parasitic diseases: Secondary | ICD-10-CM | POA: Diagnosis not present

## 2015-03-09 DIAGNOSIS — Z87828 Personal history of other (healed) physical injury and trauma: Secondary | ICD-10-CM | POA: Diagnosis not present

## 2015-03-09 DIAGNOSIS — Z87440 Personal history of urinary (tract) infections: Secondary | ICD-10-CM | POA: Insufficient documentation

## 2015-03-09 DIAGNOSIS — R000 Tachycardia, unspecified: Secondary | ICD-10-CM | POA: Insufficient documentation

## 2015-03-09 DIAGNOSIS — F419 Anxiety disorder, unspecified: Secondary | ICD-10-CM | POA: Insufficient documentation

## 2015-03-09 DIAGNOSIS — T7840XA Allergy, unspecified, initial encounter: Secondary | ICD-10-CM

## 2015-03-09 DIAGNOSIS — R011 Cardiac murmur, unspecified: Secondary | ICD-10-CM | POA: Insufficient documentation

## 2015-03-09 LAB — URINALYSIS, ROUTINE W REFLEX MICROSCOPIC
Bilirubin Urine: NEGATIVE
GLUCOSE, UA: NEGATIVE mg/dL
Hgb urine dipstick: NEGATIVE
KETONES UR: NEGATIVE mg/dL
LEUKOCYTES UA: NEGATIVE
NITRITE: NEGATIVE
PH: 5.5 (ref 5.0–8.0)
PROTEIN: NEGATIVE mg/dL
Specific Gravity, Urine: 1.015 (ref 1.005–1.030)
Urobilinogen, UA: 0.2 mg/dL (ref 0.0–1.0)

## 2015-03-09 MED ORDER — CEPHALEXIN 500 MG PO CAPS: 500.0000 mg | ORAL_CAPSULE | Freq: Two times a day (BID) | ORAL | Status: DC

## 2015-03-09 MED ORDER — DIPHENHYDRAMINE HCL 25 MG PO CAPS
50.0000 mg | ORAL_CAPSULE | Freq: Once | ORAL | Status: AC
  Administered 2015-03-09: 50 mg via ORAL
  Filled 2015-03-09: qty 2

## 2015-03-09 MED ORDER — ACETAMINOPHEN 325 MG PO TABS
650.0000 mg | ORAL_TABLET | Freq: Once | ORAL | Status: AC
  Administered 2015-03-09: 650 mg via ORAL
  Filled 2015-03-09: qty 2

## 2015-03-09 MED ORDER — RANITIDINE HCL 150 MG/10ML PO SYRP
150.0000 mg | ORAL_SOLUTION | Freq: Once | ORAL | Status: AC
  Administered 2015-03-09: 150 mg via ORAL
  Filled 2015-03-09: qty 10

## 2015-03-09 NOTE — ED Notes (Signed)
Pt stable, ambulatory, states understanding of discharge instructions 
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
## 2015-03-09 NOTE — Discharge Instructions (Signed)
Drug Allergy °Allergic reactions to medicines are common. Some allergic reactions are mild. A delayed type of drug allergy that occurs 1 week or more after exposure to a medicine or vaccine is called serum sickness. A life-threatening, sudden (acute) allergic reaction that involves the whole body is called anaphylaxis. °CAUSES  °"True" drug allergies occur when there is an allergic reaction to a medicine. This is caused by overactivity of the immune system. First, the body becomes sensitized. The immune system is triggered by your first exposure to the medicine. Following this first exposure, future exposure to the same medicine may be life-threatening. °Almost any medicine can cause an allergic reaction. Common ones are: °· Penicillin. °· Sulfonamides (sulfa drugs). °· Local anesthetics. °· X-ray dyes that contain iodine. °SYMPTOMS  °Common symptoms of a minor allergic reaction are: °· Swelling around the mouth. °· An itchy red rash or hives. °· Vomiting or diarrhea. °Anaphylaxis can cause swelling of the mouth and throat. This makes it difficult to breathe and swallow. Severe reactions can be fatal within seconds, even after exposure to only a trace amount of the drug that causes the reaction. °HOME CARE INSTRUCTIONS  °· If you are unsure of what caused your reaction, keep a diary of foods and medicines used. Include the symptoms that followed. Avoid anything that causes reactions. °· You may want to follow up with an allergy specialist after the reaction has cleared in order to be tested to confirm the allergy. It is important to confirm that your reaction is an allergy, not just a side effect to the medicine. If you have a true allergy to a medicine, this may prevent that medicine and related medicines from being given to you when you are very ill. °· If you have hives or a rash: °¨ Take medicines as directed by your caregiver. °¨ You may use an over-the-counter antihistamine (diphenhydramine) as  needed. °¨ Apply cold compresses to the skin or take baths in cool water. Avoid hot baths or showers. °· If you are severely allergic: °¨ Continuous observation after a severe reaction may be needed. Hospitalization is often required. °¨ Wear a medical alert bracelet or necklace stating your allergy. °¨ You and your family must learn how to use an anaphylaxis kit or give an epinephrine injection to temporarily treat an emergency allergic reaction. If you have had a severe reaction, always carry your epinephrine injection or anaphylaxis kit with you. This can be lifesaving if you have a severe reaction. °· Do not drive or perform tasks after treatment until the medicines used to treat your reaction have worn off, or until your caregiver says it is okay. °SEEK MEDICAL CARE IF:  °· You think you had an allergic reaction. Symptoms usually start within 30 minutes after exposure. °· Symptoms are getting worse rather than better. °· You develop new symptoms. °· The symptoms that brought you to your caregiver return. °SEEK IMMEDIATE MEDICAL CARE IF:  °· You have swelling of the mouth, difficulty breathing, or wheezing. °· You have a tight feeling in your chest or throat. °· You develop hives, swelling, or itching all over your body. °· You develop severe vomiting or diarrhea. °· You feel faint or pass out. °This is an emergency. Use your epinephrine injection or anaphylaxis kit as you have been instructed. Call for emergency medical help. Even if you improve after the injection, you need to be examined at a hospital emergency department. °MAKE SURE YOU:  °· Understand these instructions. °· Will watch   your condition.  Will get help right away if you are not doing well or get worse. Document Released: 05/26/2005 Document Revised: 08/18/2011 Document Reviewed: 10/30/2010 [facility] Patient Information 2015 [place], [place]. This information is not intended to replace advice given to you by your health care provider. Make  sure you discuss any questions you have with your health care provider.

## 2015-03-09 NOTE — ED Notes (Signed)
Pt here for possible allergic reaction to abx, tetracycline. sts she has been on different abx to treat UTI and no better. sts that she feels like her throat is swollen, hives, she feels like her heart is racing.

## 2015-03-10 LAB — URINE CULTURE: Culture: NO GROWTH

## 2015-05-31 ENCOUNTER — Other Ambulatory Visit: Payer: Self-pay | Admitting: Orthopedic Surgery

## 2015-10-27 ENCOUNTER — Inpatient Hospital Stay (HOSPITAL_COMMUNITY)
Admission: AD | Admit: 2015-10-27 | Discharge: 2015-10-27 | Disposition: A | Discharge status: Home / Self Care | Payer: Managed Care, Other (non HMO) | Source: Ambulatory Visit | Attending: Obstetrics and Gynecology | Admitting: Obstetrics and Gynecology

## 2015-10-27 ENCOUNTER — Inpatient Hospital Stay (HOSPITAL_COMMUNITY): Payer: Managed Care, Other (non HMO)

## 2015-10-27 ENCOUNTER — Encounter (HOSPITAL_COMMUNITY): Payer: Self-pay

## 2015-10-27 DIAGNOSIS — R102 Pelvic and perineal pain: Secondary | ICD-10-CM

## 2015-10-27 DIAGNOSIS — R1031 Right lower quadrant pain: Secondary | ICD-10-CM

## 2015-10-27 DIAGNOSIS — Z90710 Acquired absence of both cervix and uterus: Secondary | ICD-10-CM | POA: Diagnosis not present

## 2015-10-27 LAB — WET PREP, GENITAL
CLUE CELLS WET PREP: NONE SEEN
SPERM: NONE SEEN
TRICH WET PREP: NONE SEEN
YEAST WET PREP: NONE SEEN

## 2015-10-27 LAB — URINALYSIS, ROUTINE W REFLEX MICROSCOPIC
Bilirubin Urine: NEGATIVE
Glucose, UA: NEGATIVE mg/dL
HGB URINE DIPSTICK: NEGATIVE
Ketones, ur: NEGATIVE mg/dL
LEUKOCYTES UA: NEGATIVE
Nitrite: NEGATIVE
PROTEIN: NEGATIVE mg/dL
SPECIFIC GRAVITY, URINE: 1.025 (ref 1.005–1.030)
pH: 6 (ref 5.0–8.0)

## 2015-10-27 LAB — CBC
HCT: 36.1 % (ref 36.0–46.0)
HEMOGLOBIN: 12.4 g/dL (ref 12.0–15.0)
MCH: 33.8 pg (ref 26.0–34.0)
MCHC: 34.3 g/dL (ref 30.0–36.0)
MCV: 98.4 fL (ref 78.0–100.0)
PLATELETS: 145 10*3/uL — AB (ref 150–400)
RBC: 3.67 MIL/uL — ABNORMAL LOW (ref 3.87–5.11)
RDW: 12.6 % (ref 11.5–15.5)
WBC: 5.6 10*3/uL (ref 4.0–10.5)

## 2015-10-27 NOTE — MAU Note (Signed)
Patient offered Ibuprofen 800 mg declined stating she would pick up some OTC.

## 2015-10-27 NOTE — MAU Note (Signed)
Patient presents with right side groin pain feels bloated, hurts to touch the area, aching and burning, has had a hysterectomy for endometriosis, still has ovaries.
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
## 2015-10-27 NOTE — Discharge Instructions (Signed)

## 2015-10-28 DIAGNOSIS — R1031 Right lower quadrant pain: Secondary | ICD-10-CM

## 2015-10-28 LAB — RPR: RPR: NONREACTIVE

## 2015-10-28 LAB — HIV ANTIBODY (ROUTINE TESTING W REFLEX): HIV SCREEN 4TH GENERATION: NONREACTIVE

## 2015-10-28 LAB — HEPATITIS C ANTIBODY (REFLEX): HCV AB: 0.1 {s_co_ratio} (ref 0.0–0.9)

## 2015-10-28 LAB — HCV COMMENT:

## 2015-10-29 LAB — GC/CHLAMYDIA PROBE AMP (~~LOC~~) NOT AT ARMC
CHLAMYDIA, DNA PROBE: NEGATIVE
Neisseria Gonorrhea: NEGATIVE

## 2016-06-09 HISTORY — PX: LIPOSUCTION TRUNK: SUR833

## 2016-11-24 ENCOUNTER — Other Ambulatory Visit: Payer: Self-pay | Admitting: Dermatology

## 2016-12-31 ENCOUNTER — Other Ambulatory Visit: Payer: Self-pay | Admitting: Physician Assistant

## 2016-12-31 DIAGNOSIS — R197 Diarrhea, unspecified: Secondary | ICD-10-CM

## 2017-01-05 ENCOUNTER — Other Ambulatory Visit: Payer: Commercial Managed Care - HMO

## 2017-01-08 ENCOUNTER — Ambulatory Visit
Admission: RE | Admit: 2017-01-08 | Discharge: 2017-01-08 | Disposition: A | Discharge status: Home / Self Care | Payer: Managed Care, Other (non HMO) | Source: Ambulatory Visit | Attending: Physician Assistant | Admitting: Physician Assistant

## 2017-01-08 DIAGNOSIS — R197 Diarrhea, unspecified: Secondary | ICD-10-CM

## 2017-01-08 MED ORDER — IOPAMIDOL (ISOVUE-300) INJECTION 61%
100.0000 mL | Freq: Once | INTRAVENOUS | Status: AC | PRN
  Administered 2017-01-08: 100 mL via INTRAVENOUS

## 2017-04-20 ENCOUNTER — Encounter: Payer: Self-pay | Admitting: Obstetrics & Gynecology

## 2017-10-26 DIAGNOSIS — N3000 Acute cystitis without hematuria: Secondary | ICD-10-CM | POA: Diagnosis not present

## 2017-10-26 DIAGNOSIS — N898 Other specified noninflammatory disorders of vagina: Secondary | ICD-10-CM | POA: Diagnosis not present

## 2017-10-26 DIAGNOSIS — R300 Dysuria: Secondary | ICD-10-CM | POA: Diagnosis not present

## 2018-03-12 DIAGNOSIS — Z0000 Encounter for general adult medical examination without abnormal findings: Secondary | ICD-10-CM | POA: Diagnosis not present

## 2018-03-12 DIAGNOSIS — G479 Sleep disorder, unspecified: Secondary | ICD-10-CM | POA: Diagnosis not present

## 2018-04-06 DIAGNOSIS — M67441 Ganglion, right hand: Secondary | ICD-10-CM | POA: Diagnosis not present

## 2018-04-06 DIAGNOSIS — M79644 Pain in right finger(s): Secondary | ICD-10-CM | POA: Diagnosis not present

## 2018-04-28 DIAGNOSIS — Z01419 Encounter for gynecological examination (general) (routine) without abnormal findings: Secondary | ICD-10-CM | POA: Diagnosis not present

## 2018-04-28 DIAGNOSIS — N941 Unspecified dyspareunia: Secondary | ICD-10-CM | POA: Diagnosis not present

## 2018-04-28 DIAGNOSIS — N809 Endometriosis, unspecified: Secondary | ICD-10-CM | POA: Diagnosis not present

## 2018-04-28 DIAGNOSIS — Z6824 Body mass index (BMI) 24.0-24.9, adult: Secondary | ICD-10-CM | POA: Diagnosis not present

## 2018-04-28 DIAGNOSIS — Z1231 Encounter for screening mammogram for malignant neoplasm of breast: Secondary | ICD-10-CM | POA: Diagnosis not present

## 2018-08-03 ENCOUNTER — Encounter: Payer: Self-pay | Admitting: Obstetrics & Gynecology

## 2018-08-09 ENCOUNTER — Ambulatory Visit (INDEPENDENT_AMBULATORY_CARE_PROVIDER_SITE_OTHER): Payer: BLUE CROSS/BLUE SHIELD | Admitting: Obstetrics & Gynecology

## 2018-08-09 ENCOUNTER — Other Ambulatory Visit: Payer: Self-pay

## 2018-08-09 ENCOUNTER — Encounter: Payer: Self-pay | Admitting: Obstetrics & Gynecology

## 2018-08-09 VITALS — BP 120/82 | HR 88 | Resp 16 | Ht 61.5 in | Wt 128.8 lb

## 2018-08-09 DIAGNOSIS — G4700 Insomnia, unspecified: Secondary | ICD-10-CM | POA: Diagnosis not present

## 2018-08-09 DIAGNOSIS — R232 Flushing: Secondary | ICD-10-CM

## 2018-08-09 DIAGNOSIS — B3731 Acute candidiasis of vulva and vagina: Secondary | ICD-10-CM | POA: Insufficient documentation

## 2018-08-09 DIAGNOSIS — N809 Endometriosis, unspecified: Secondary | ICD-10-CM | POA: Insufficient documentation

## 2018-08-09 DIAGNOSIS — B373 Candidiasis of vulva and vagina: Secondary | ICD-10-CM | POA: Insufficient documentation

## 2018-08-09 DIAGNOSIS — B009 Herpesviral infection, unspecified: Secondary | ICD-10-CM | POA: Diagnosis not present

## 2018-08-09 DIAGNOSIS — N941 Unspecified dyspareunia: Secondary | ICD-10-CM | POA: Insufficient documentation

## 2018-08-09 MED ORDER — VALACYCLOVIR HCL 500 MG PO TABS: ORAL_TABLET | ORAL | 2 refills | Status: DC

## 2018-08-09 MED ORDER — GABAPENTIN 100 MG PO CAPS: ORAL_CAPSULE | ORAL | 0 refills | Status: DC

## 2018-08-09 MED ORDER — TERCONAZOLE 0.4 % VA CREA: 1.0000 | TOPICAL_CREAM | Freq: Every day | VAGINAL | 3 refills | Status: DC
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
## 2018-08-10 LAB — FOLLICLE STIMULATING HORMONE: FSH: 21.6 m[IU]/mL

## 2018-08-11 ENCOUNTER — Telehealth: Payer: Self-pay | Admitting: *Deleted

## 2018-08-11 NOTE — Telephone Encounter (Signed)
Dr. Hyacinth Meeker -please review [facility] labs dated 08/09/18 and advise.

## 2018-08-11 NOTE — Telephone Encounter (Signed)
Patient calling for test results. °

## 2018-08-12 ENCOUNTER — Encounter: Payer: Self-pay | Admitting: *Deleted

## 2018-08-12 NOTE — Telephone Encounter (Signed)
Patient called again to see if her results are ready for her.

## 2018-08-12 NOTE — Telephone Encounter (Signed)
Routing to Dr. Miller to review and advise.  

## 2018-08-13 NOTE — Telephone Encounter (Signed)
Spoke with patient. Patient states she started gabapentin 100 mg nightly on 3/2. Still seeing "increased sweating during the day, seems to be helping me sleep at night". Patient states she just does not want to continue medication if its not needed. Advised patient I will update Dr. Hyacinth Meeker, awaiting return response regarding 3/2 labs and recommendations, will f/u once reviewed. Patient verbalizes understanding.

## 2018-08-13 NOTE — Addendum Note (Signed)
Addended by: Jerene Bears on: 08/13/2018 01:33 PM   Modules accepted: Orders

## 2018-08-13 NOTE — Telephone Encounter (Signed)
-----   Message from Jerene Bears, MD sent at 08/13/2018  1:32 PM EST ----- Please let pt know her FSH was 21.  This is elevated but not in full menopausal range.  This elevated could cause hot flashes but there are several other things that she should have tested:  CBC, TSH, HbA1C, CMP, ferritin.  If these are normal, I may also want to test urine so she should get a 24 hour urine container just in case this is needed.  Orders placed for future labs.  These are not fasting.

## 2018-08-13 NOTE — Telephone Encounter (Signed)
Patient calling for results. Would like a return call from a nurse to discuss concerns with medication she is taking while awaiting results.

## 2018-08-13 NOTE — Telephone Encounter (Signed)
Spoke with patient, advised as seen below per Dr. Hyacinth Meeker. Patient agreeable to plan, will continue gabapentin for now as prescribed. Lab appt scheduled for 08/18/18 at 0830, will pick up 24 hr urine collection container at that time. Patient verbalizes understanding and is agreeable.   Routing to provider for final review. Patient is agreeable to disposition. Will close encounter.

## 2018-08-18 ENCOUNTER — Other Ambulatory Visit: Payer: Self-pay

## 2018-08-18 ENCOUNTER — Telehealth: Payer: Self-pay | Admitting: *Deleted

## 2018-08-18 ENCOUNTER — Other Ambulatory Visit: Payer: Self-pay | Admitting: Obstetrics & Gynecology

## 2018-08-18 DIAGNOSIS — R232 Flushing: Secondary | ICD-10-CM

## 2018-08-18 NOTE — Telephone Encounter (Signed)
Called to patient regarding missed lab appointment. No answer and mailbox is full. °

## 2019-02-08 DIAGNOSIS — R197 Diarrhea, unspecified: Secondary | ICD-10-CM | POA: Diagnosis not present

## 2019-02-08 DIAGNOSIS — R1084 Generalized abdominal pain: Secondary | ICD-10-CM | POA: Diagnosis not present

## 2019-02-18 DIAGNOSIS — Z1159 Encounter for screening for other viral diseases: Secondary | ICD-10-CM | POA: Diagnosis not present

## 2019-02-23 DIAGNOSIS — K5730 Diverticulosis of large intestine without perforation or abscess without bleeding: Secondary | ICD-10-CM | POA: Diagnosis not present

## 2019-02-23 DIAGNOSIS — K635 Polyp of colon: Secondary | ICD-10-CM | POA: Diagnosis not present

## 2019-02-23 DIAGNOSIS — K640 First degree hemorrhoids: Secondary | ICD-10-CM | POA: Diagnosis not present

## 2019-02-23 DIAGNOSIS — R197 Diarrhea, unspecified: Secondary | ICD-10-CM | POA: Diagnosis not present

## 2019-02-23 DIAGNOSIS — R1032 Left lower quadrant pain: Secondary | ICD-10-CM | POA: Diagnosis not present

## 2019-04-13 ENCOUNTER — Other Ambulatory Visit: Payer: Self-pay | Admitting: Gastroenterology

## 2019-04-13 DIAGNOSIS — R1084 Generalized abdominal pain: Secondary | ICD-10-CM

## 2019-04-13 DIAGNOSIS — R140 Abdominal distension (gaseous): Secondary | ICD-10-CM

## 2019-04-22 ENCOUNTER — Other Ambulatory Visit: Payer: Managed Care, Other (non HMO)

## 2019-05-24 DIAGNOSIS — R1010 Upper abdominal pain, unspecified: Secondary | ICD-10-CM | POA: Diagnosis not present

## 2019-05-25 ENCOUNTER — Other Ambulatory Visit: Payer: Self-pay | Admitting: Physician Assistant

## 2019-05-25 DIAGNOSIS — R1010 Upper abdominal pain, unspecified: Secondary | ICD-10-CM

## 2019-05-30 ENCOUNTER — Ambulatory Visit (HOSPITAL_COMMUNITY)
Admission: EM | Admit: 2019-05-30 | Discharge: 2019-05-30 | Disposition: A | Discharge status: Home / Self Care | Payer: Managed Care, Other (non HMO) | Attending: Internal Medicine | Admitting: Internal Medicine

## 2019-05-30 ENCOUNTER — Encounter (HOSPITAL_COMMUNITY): Payer: Self-pay | Admitting: Emergency Medicine

## 2019-05-30 ENCOUNTER — Other Ambulatory Visit: Payer: Self-pay

## 2019-05-30 DIAGNOSIS — R109 Unspecified abdominal pain: Secondary | ICD-10-CM

## 2019-05-30 LAB — BASIC METABOLIC PANEL
Anion gap: 11 (ref 5–15)
BUN: 15 mg/dL (ref 6–20)
CO2: 24 mmol/L (ref 22–32)
Calcium: 9.7 mg/dL (ref 8.9–10.3)
Chloride: 101 mmol/L (ref 98–111)
Creatinine, Ser: 0.63 mg/dL (ref 0.44–1.00)
GFR calc Af Amer: >60 mL/min (ref >60)
GFR calc non Af Amer: >60 mL/min (ref >60)
Glucose, Bld: 90 mg/dL (ref 70–99)
Potassium: 3.7 mmol/L (ref 3.5–5.1)
Sodium: 136 mmol/L (ref 135–145)

## 2019-05-30 LAB — CBC
HCT: 37.5 % (ref 36.0–46.0)
Hemoglobin: 12.7 g/dL (ref 12.0–15.0)
MCH: 34.8 pg — ABNORMAL HIGH (ref 26.0–34.0)
MCHC: 33.9 g/dL (ref 30.0–36.0)
MCV: 102.7 fL — ABNORMAL HIGH (ref 80.0–100.0)
Platelets: 191 10*3/uL (ref 150–400)
RBC: 3.65 MIL/uL — ABNORMAL LOW (ref 3.87–5.11)
RDW: 12.1 % (ref 11.5–15.5)
WBC: 6.2 10*3/uL (ref 4.0–10.5)
nRBC: 0 % (ref 0.0–0.2)

## 2019-05-30 LAB — TSH: TSH: 2.014 u[IU]/mL (ref 0.350–4.500)

## 2019-05-30 MED ORDER — PANTOPRAZOLE SODIUM 20 MG PO TBEC: 20.0000 mg | DELAYED_RELEASE_TABLET | Freq: Every day | ORAL | 0 refills | Status: DC

## 2019-05-30 MED ORDER — PANTOPRAZOLE SODIUM 20 MG PO TBEC: 20.0000 mg | DELAYED_RELEASE_TABLET | Freq: Every day | ORAL | Status: DC
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
## 2019-05-30 NOTE — ED Triage Notes (Signed)
Pt states she has been having stomach issues for 6 months.  She states she has only had virtual visits and she did have a colonoscopy a few months ago.  Pt's PCP told her to come here for further workup.

## 2019-06-07 ENCOUNTER — Other Ambulatory Visit: Payer: Managed Care, Other (non HMO)

## 2019-06-13 ENCOUNTER — Ambulatory Visit
Admission: RE | Admit: 2019-06-13 | Discharge: 2019-06-13 | Disposition: A | Discharge status: Home / Self Care | Payer: Managed Care, Other (non HMO) | Source: Ambulatory Visit | Attending: Physician Assistant | Admitting: Physician Assistant

## 2019-06-13 DIAGNOSIS — R1010 Upper abdominal pain, unspecified: Secondary | ICD-10-CM

## 2019-06-29 DIAGNOSIS — K589 Irritable bowel syndrome without diarrhea: Secondary | ICD-10-CM | POA: Diagnosis not present

## 2019-06-29 DIAGNOSIS — R140 Abdominal distension (gaseous): Secondary | ICD-10-CM | POA: Diagnosis not present

## 2019-06-29 DIAGNOSIS — R110 Nausea: Secondary | ICD-10-CM | POA: Diagnosis not present

## 2019-06-29 DIAGNOSIS — R1012 Left upper quadrant pain: Secondary | ICD-10-CM | POA: Diagnosis not present

## 2019-07-04 ENCOUNTER — Other Ambulatory Visit: Payer: Self-pay | Admitting: Physician Assistant

## 2019-07-04 DIAGNOSIS — R1012 Left upper quadrant pain: Secondary | ICD-10-CM

## 2019-07-11 ENCOUNTER — Ambulatory Visit
Admission: RE | Admit: 2019-07-11 | Discharge: 2019-07-11 | Disposition: A | Discharge status: Home / Self Care | Payer: Commercial Managed Care - PPO | Source: Ambulatory Visit | Attending: Physician Assistant | Admitting: Physician Assistant

## 2019-07-11 ENCOUNTER — Other Ambulatory Visit: Payer: Self-pay

## 2019-07-11 DIAGNOSIS — R1012 Left upper quadrant pain: Secondary | ICD-10-CM

## 2019-07-11 MED ORDER — IOPAMIDOL (ISOVUE-300) INJECTION 61%
100.0000 mL | Freq: Once | INTRAVENOUS | Status: AC | PRN
  Administered 2019-07-11: 100 mL via INTRAVENOUS

## 2019-07-13 ENCOUNTER — Ambulatory Visit
Admission: RE | Admit: 2019-07-13 | Discharge: 2019-07-13 | Disposition: A | Discharge status: Home / Self Care | Payer: Commercial Managed Care - PPO | Source: Ambulatory Visit | Attending: Physician Assistant | Admitting: Physician Assistant

## 2019-07-13 ENCOUNTER — Other Ambulatory Visit: Payer: Self-pay | Admitting: Physician Assistant

## 2019-07-13 DIAGNOSIS — R935 Abnormal findings on diagnostic imaging of other abdominal regions, including retroperitoneum: Secondary | ICD-10-CM
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
## 2019-12-23 ENCOUNTER — Ambulatory Visit: Payer: BLUE CROSS/BLUE SHIELD | Admitting: Obstetrics & Gynecology

## 2020-02-23 DIAGNOSIS — R140 Abdominal distension (gaseous): Secondary | ICD-10-CM | POA: Diagnosis not present

## 2020-02-23 DIAGNOSIS — R102 Pelvic and perineal pain: Secondary | ICD-10-CM | POA: Diagnosis not present

## 2020-02-23 DIAGNOSIS — Z1231 Encounter for screening mammogram for malignant neoplasm of breast: Secondary | ICD-10-CM | POA: Diagnosis not present

## 2020-02-23 DIAGNOSIS — N803 Endometriosis of pelvic peritoneum: Secondary | ICD-10-CM | POA: Diagnosis not present

## 2020-04-04 DIAGNOSIS — N803 Endometriosis of pelvic peritoneum: Secondary | ICD-10-CM | POA: Diagnosis not present

## 2020-04-04 DIAGNOSIS — R140 Abdominal distension (gaseous): Secondary | ICD-10-CM | POA: Diagnosis not present

## 2020-05-18 DIAGNOSIS — K589 Irritable bowel syndrome without diarrhea: Secondary | ICD-10-CM | POA: Diagnosis not present

## 2020-05-18 DIAGNOSIS — R140 Abdominal distension (gaseous): Secondary | ICD-10-CM | POA: Diagnosis not present

## 2020-06-04 DIAGNOSIS — N809 Endometriosis, unspecified: Secondary | ICD-10-CM | POA: Diagnosis not present

## 2020-06-04 DIAGNOSIS — B373 Candidiasis of vulva and vagina: Secondary | ICD-10-CM | POA: Diagnosis not present

## 2020-06-04 DIAGNOSIS — Z09 Encounter for follow-up examination after completed treatment for conditions other than malignant neoplasm: Secondary | ICD-10-CM | POA: Diagnosis not present

## 2020-06-29 DIAGNOSIS — Z713 Dietary counseling and surveillance: Secondary | ICD-10-CM | POA: Diagnosis not present

## 2020-07-12 DIAGNOSIS — Z713 Dietary counseling and surveillance: Secondary | ICD-10-CM | POA: Diagnosis not present

## 2020-07-23 ENCOUNTER — Ambulatory Visit: Payer: Self-pay

## 2020-07-26 DIAGNOSIS — Z713 Dietary counseling and surveillance: Secondary | ICD-10-CM | POA: Diagnosis not present

## 2020-07-31 DIAGNOSIS — Z8349 Family history of other endocrine, nutritional and metabolic diseases: Secondary | ICD-10-CM | POA: Diagnosis not present

## 2020-07-31 DIAGNOSIS — Z0000 Encounter for general adult medical examination without abnormal findings: Secondary | ICD-10-CM | POA: Diagnosis not present

## 2020-07-31 DIAGNOSIS — Z13220 Encounter for screening for lipoid disorders: Secondary | ICD-10-CM | POA: Diagnosis not present

## 2020-08-30 DIAGNOSIS — Z713 Dietary counseling and surveillance: Secondary | ICD-10-CM | POA: Diagnosis not present

## 2020-10-04 DIAGNOSIS — Z713 Dietary counseling and surveillance: Secondary | ICD-10-CM | POA: Diagnosis not present

## 2020-10-04 DIAGNOSIS — S6991XA Unspecified injury of right wrist, hand and finger(s), initial encounter: Secondary | ICD-10-CM | POA: Diagnosis not present

## 2020-10-23 ENCOUNTER — Other Ambulatory Visit: Payer: Self-pay

## 2020-10-23 ENCOUNTER — Other Ambulatory Visit (HOSPITAL_COMMUNITY)
Admission: RE | Admit: 2020-10-23 | Discharge: 2020-10-23 | Disposition: A | Discharge status: Home / Self Care | Payer: Self-pay | Source: Ambulatory Visit | Attending: Obstetrics & Gynecology | Admitting: Obstetrics & Gynecology

## 2020-10-23 ENCOUNTER — Ambulatory Visit (INDEPENDENT_AMBULATORY_CARE_PROVIDER_SITE_OTHER): Payer: Self-pay | Admitting: Obstetrics & Gynecology

## 2020-10-23 ENCOUNTER — Other Ambulatory Visit (HOSPITAL_BASED_OUTPATIENT_CLINIC_OR_DEPARTMENT_OTHER)
Admission: RE | Admit: 2020-10-23 | Discharge: 2020-10-23 | Disposition: A | Discharge status: Home / Self Care | Payer: Self-pay | Source: Ambulatory Visit | Attending: Obstetrics & Gynecology | Admitting: Obstetrics & Gynecology

## 2020-10-23 ENCOUNTER — Encounter (HOSPITAL_BASED_OUTPATIENT_CLINIC_OR_DEPARTMENT_OTHER): Payer: Self-pay | Admitting: Obstetrics & Gynecology

## 2020-10-23 VITALS — Ht 61.5 in | Wt 141.0 lb

## 2020-10-23 DIAGNOSIS — B009 Herpesviral infection, unspecified: Secondary | ICD-10-CM

## 2020-10-23 DIAGNOSIS — N941 Unspecified dyspareunia: Secondary | ICD-10-CM

## 2020-10-23 DIAGNOSIS — N951 Menopausal and female climacteric states: Secondary | ICD-10-CM | POA: Insufficient documentation

## 2020-10-23 DIAGNOSIS — N898 Other specified noninflammatory disorders of vagina: Secondary | ICD-10-CM | POA: Insufficient documentation

## 2020-10-23 DIAGNOSIS — N809 Endometriosis, unspecified: Secondary | ICD-10-CM

## 2020-10-23 MED ORDER — GABAPENTIN 100 MG PO CAPS: ORAL_CAPSULE | ORAL | 3 refills | Status: DC

## 2020-10-23 MED ORDER — TERCONAZOLE 0.8 % VA CREA: 1.0000 | TOPICAL_CREAM | Freq: Every day | VAGINAL | 1 refills | Status: DC

## 2020-10-23 MED ORDER — GABAPENTIN 300 MG PO CAPS: 300.0000 mg | ORAL_CAPSULE | Freq: Three times a day (TID) | ORAL | Status: DC

## 2020-10-23 MED ORDER — VALACYCLOVIR HCL 1 G PO TABS: ORAL_TABLET | ORAL | 1 refills | Status: DC
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
## 2020-10-24 LAB — CERVICOVAGINAL ANCILLARY ONLY
Bacterial Vaginitis (gardnerella): NEGATIVE
Candida Glabrata: NEGATIVE
Candida Vaginitis: POSITIVE — AB
Comment: NEGATIVE
Comment: NEGATIVE
Comment: NEGATIVE

## 2020-10-24 LAB — FOLLICLE STIMULATING HORMONE: FSH: 34.4 m[IU]/mL

## 2020-10-24 LAB — ESTRADIOL: Estradiol: 19.7 pg/mL

## 2020-10-25 ENCOUNTER — Encounter (HOSPITAL_BASED_OUTPATIENT_CLINIC_OR_DEPARTMENT_OTHER): Payer: Self-pay

## 2020-11-06 DIAGNOSIS — Z713 Dietary counseling and surveillance: Secondary | ICD-10-CM | POA: Diagnosis not present

## 2020-11-22 ENCOUNTER — Telehealth (HOSPITAL_BASED_OUTPATIENT_CLINIC_OR_DEPARTMENT_OTHER): Payer: Self-pay | Admitting: Obstetrics & Gynecology

## 2020-11-22 ENCOUNTER — Other Ambulatory Visit (HOSPITAL_BASED_OUTPATIENT_CLINIC_OR_DEPARTMENT_OTHER): Payer: Self-pay | Admitting: Obstetrics & Gynecology

## 2020-11-22 ENCOUNTER — Encounter (HOSPITAL_BASED_OUTPATIENT_CLINIC_OR_DEPARTMENT_OTHER): Payer: Self-pay

## 2020-11-22 DIAGNOSIS — N951 Menopausal and female climacteric states: Secondary | ICD-10-CM

## 2020-11-22 MED ORDER — PROGESTERONE 200 MG PO CAPS: 200.0000 mg | ORAL_CAPSULE | Freq: Every evening | ORAL | 4 refills | Status: DC

## 2020-11-22 MED ORDER — GABAPENTIN 300 MG PO CAPS: 300.0000 mg | ORAL_CAPSULE | Freq: Three times a day (TID) | ORAL | 3 refills | Status: DC

## 2020-11-22 NOTE — Telephone Encounter (Signed)
Please advise. tbw 

## 2020-11-23 NOTE — Telephone Encounter (Signed)
Pt called after she sent my chart message.  She is having worsening hot flashes and wants to discuss HRT.  Has friend on HRT.  Asks that is has not risks, correct?  Advised of DVT, PE, stroke, breast cancer risks and age relation to these.  Do feel she is a candidate for HRT.  Pt only wants to start progesterone.  Decreased risks with this reviewed.  Pt is going to take Prometrium 200mg  nightly for next 2-3 weeks and see if this helps.  If not, she will call back.  Rx sent to pharmacy.

## 2020-12-04 DIAGNOSIS — Z713 Dietary counseling and surveillance: Secondary | ICD-10-CM | POA: Diagnosis not present

## 2021-03-27 ENCOUNTER — Encounter (HOSPITAL_BASED_OUTPATIENT_CLINIC_OR_DEPARTMENT_OTHER): Payer: Self-pay

## 2021-04-03 ENCOUNTER — Encounter (HOSPITAL_BASED_OUTPATIENT_CLINIC_OR_DEPARTMENT_OTHER): Payer: Self-pay

## 2021-04-03 ENCOUNTER — Other Ambulatory Visit: Payer: Self-pay

## 2021-04-03 ENCOUNTER — Telehealth (HOSPITAL_BASED_OUTPATIENT_CLINIC_OR_DEPARTMENT_OTHER): Payer: BC Managed Care – PPO | Admitting: Obstetrics & Gynecology

## 2021-04-10 ENCOUNTER — Other Ambulatory Visit: Payer: Self-pay

## 2021-04-10 ENCOUNTER — Telehealth (INDEPENDENT_AMBULATORY_CARE_PROVIDER_SITE_OTHER): Payer: BC Managed Care – PPO | Admitting: Obstetrics & Gynecology

## 2021-04-10 ENCOUNTER — Encounter (HOSPITAL_BASED_OUTPATIENT_CLINIC_OR_DEPARTMENT_OTHER): Payer: Self-pay | Admitting: Obstetrics & Gynecology

## 2021-04-10 DIAGNOSIS — N951 Menopausal and female climacteric states: Secondary | ICD-10-CM

## 2021-04-10 MED ORDER — CITALOPRAM HYDROBROMIDE 10 MG PO TABS: 10.0000 mg | ORAL_TABLET | Freq: Every day | ORAL | 1 refills | Status: DC

## 2021-04-10 NOTE — Progress Notes (Signed)
Virtual Visit via Video Note  I connected with Michelle Combs on 04/10/21 at 10:00 AM EDT by a video enabled telemedicine application and verified that I am speaking with the correct person using two identifiers.  Location: Patient: home Provider: office   I discussed the limitations of evaluation and management by telemedicine and the availability of in person appointments. The patient expressed understanding and agreed to proceed.  History of Present Illness: 50 yo G4P2 DWF for virtual visit to discuss hot flashes.  She is using gabapentin 300mg  in AM, mid day, and using 400 to 500mg  at night.  She has titrated up the dosage in the evening.  She was not using any hormonal therapy but has started Prometrium 200mg  nightly and has been on this for about a month.  We discussed options including increasing gabapentin.     Observations/Objective: WNWD, NAD  Assessment and Plan: 1. Vasomotor symptoms due to menopause - options dicussed included starting estrogen HRT therapy, increasing gabapentin dosing, starting SSRI or SNRI therapy - after discussion, pt decided to start citalopram (CELEXA) 10 MG tablet; Take 1 tablet (10 mg total) by mouth daily.  Dispense: 30 tablet; Refill: 1. She is most concerned about weight gain.  Clinical study findings of <1% weight gain in patients discussed.  - after discussion, decided to stop progesterone as well as this does not seem to be helping at all.  Follow Up Instructions: - Pt will give update in 1 month   I discussed the assessment and treatment plan with the patient. The patient was provided an opportunity to ask questions and all were answered. The patient agreed with the plan and demonstrated an understanding of the instructions.   The patient was advised to call back or seek an in-person evaluation if the symptoms worsen or if the condition fails to improve as anticipated.  I provided 23 minutes of non-face-to-face time during this  encounter.   , MD

## 2021-05-07 ENCOUNTER — Other Ambulatory Visit (HOSPITAL_BASED_OUTPATIENT_CLINIC_OR_DEPARTMENT_OTHER): Payer: Self-pay | Admitting: Obstetrics & Gynecology

## 2021-05-07 ENCOUNTER — Encounter (HOSPITAL_BASED_OUTPATIENT_CLINIC_OR_DEPARTMENT_OTHER): Payer: Self-pay | Admitting: Obstetrics & Gynecology

## 2021-05-07 DIAGNOSIS — B009 Herpesviral infection, unspecified: Secondary | ICD-10-CM

## 2021-05-07 DIAGNOSIS — N951 Menopausal and female climacteric states: Secondary | ICD-10-CM

## 2021-05-07 MED ORDER — GABAPENTIN 300 MG PO CAPS: 600.0000 mg | ORAL_CAPSULE | Freq: Three times a day (TID) | ORAL | 1 refills | Status: DC

## 2021-05-07 MED ORDER — VALACYCLOVIR HCL 1 G PO TABS: ORAL_TABLET | ORAL | 1 refills | Status: DC

## 2021-05-07 NOTE — Telephone Encounter (Signed)
Please advise. tbw 

## 2021-05-23 ENCOUNTER — Ambulatory Visit: Payer: Self-pay | Admitting: Dermatology

## 2021-05-23 ENCOUNTER — Other Ambulatory Visit: Payer: Self-pay

## 2021-05-23 ENCOUNTER — Encounter: Payer: Self-pay | Admitting: Dermatology

## 2021-05-23 ENCOUNTER — Ambulatory Visit: Payer: BC Managed Care – PPO | Admitting: Dermatology

## 2021-05-23 DIAGNOSIS — L700 Acne vulgaris: Secondary | ICD-10-CM | POA: Diagnosis not present

## 2021-05-23 MED ORDER — ARAZLO 0.045 % EX LOTN: TOPICAL_LOTION | CUTANEOUS | 3 refills | Status: DC

## 2021-05-23 NOTE — Progress Notes (Signed)
° °  New Patient   Subjective  Michelle Combs is a 50 y.o. female who presents for the following: Acne (Face, some back & chest rx - dapsone 5%).  Adult acne Location:  Duration:  Quality:  Associated Signs/Symptoms: Modifying Factors:  Severity:  Timing: Context:    The following portions of the chart were reviewed this encounter and updated as appropriate:      Objective  Well appearing patient in no apparent distress; mood and affect are within normal limits. Head - Anterior (Face) Scattered comedones, inflammatory papules, and pustules.     A focused examination was performed including head, neck, upper torso.. Relevant physical exam findings are noted in the Assessment and Plan.   Assessment & Plan  Acne vulgaris Head - Anterior (Face)  Tazarotene (ARAZLO) 0.045 % LOTN - Head - Anterior (Face) APPLY PEA SIZED DOT TO THE FACE EVERY OTHER NIGHT Pearly continues to get adult female acne predominantly inflammatory, worse on the lower half of the face.  Summertime seems to be an exacerbating factor.  She does not tolerate sunscreens very well.  She does get some response to topical dapsone but is facing at least a temporary loss of health insurance benefits.  I suggested we initiate Arazlo every other night with a warning of some possibility of irritation.  She already uses a PanOxyl wash and she may want to switch from this to Neutrogena rapid clear gel or mask which she would alternate with the Arazlo.  We will make the dapsone available for a year with refills.  We also discussed the oral spironolactone and the new topical clascoterone.  Initial follow-up via MyChart or phone in 10 weeks.

## 2021-05-23 NOTE — Patient Instructions (Signed)
Michelle Combs continues to get adult female acne predominantly inflammatory, worse on the lower half of the face.  Summertime seems to be an exacerbating factor.  She does not tolerate sunscreens very well.  She does get some response to topical dapsone but is facing at least a temporary loss of health insurance benefits.  I suggested we initiate Arazlo every other night with a warning of some possibility of irritation.  She already uses a PanOxyl wash and she may want to switch from this to Neutrogena rapid clear gel or mask which she would alternate with the Arazlo.  We will make the dapsone available for a year with refills.  We also discussed the oral spironolactone and the new topical clascoterone.  Initial follow-up via MyChart or phone in 10 weeks.

## 2021-06-04 ENCOUNTER — Telehealth: Payer: Self-pay | Admitting: Dermatology

## 2021-06-04 DIAGNOSIS — L700 Acne vulgaris: Secondary | ICD-10-CM

## 2021-06-04 NOTE — Telephone Encounter (Signed)
Message for ST: Newer med (Arazlo) made her face peel. Wants to go back to dapzone, which she's used before. Pharmacy: [facility] [address] [facility])

## 2021-06-05 MED ORDER — DAPSONE 5 % EX GEL: CUTANEOUS | 3 refills | Status: DC

## 2021-06-18 ENCOUNTER — Encounter: Payer: Self-pay | Admitting: Dermatology

## 2021-06-18 NOTE — Addendum Note (Signed)
Addended by: Janalyn Harder on: 06/18/2021 07:06 AM   Modules accepted: Level of Service

## 2021-06-24 ENCOUNTER — Ambulatory Visit: Payer: Self-pay | Admitting: Dermatology

## 2021-08-07 ENCOUNTER — Encounter (HOSPITAL_BASED_OUTPATIENT_CLINIC_OR_DEPARTMENT_OTHER): Payer: Self-pay | Admitting: Obstetrics & Gynecology

## 2021-08-22 ENCOUNTER — Other Ambulatory Visit: Payer: Self-pay

## 2021-08-22 ENCOUNTER — Ambulatory Visit (INDEPENDENT_AMBULATORY_CARE_PROVIDER_SITE_OTHER): Payer: Self-pay | Admitting: Obstetrics & Gynecology

## 2021-08-22 ENCOUNTER — Encounter (HOSPITAL_BASED_OUTPATIENT_CLINIC_OR_DEPARTMENT_OTHER): Payer: Self-pay | Admitting: Obstetrics & Gynecology

## 2021-08-22 VITALS — BP 139/91 | HR 86 | Ht 62.0 in | Wt 157.8 lb

## 2021-08-22 DIAGNOSIS — R4586 Emotional lability: Secondary | ICD-10-CM

## 2021-08-22 DIAGNOSIS — N951 Menopausal and female climacteric states: Secondary | ICD-10-CM

## 2021-08-22 MED ORDER — FLUOXETINE HCL 10 MG PO TABS: 10.0000 mg | ORAL_TABLET | Freq: Every day | ORAL | 1 refills | Status: DC

## 2021-08-23 ENCOUNTER — Encounter (HOSPITAL_BASED_OUTPATIENT_CLINIC_OR_DEPARTMENT_OTHER): Payer: Self-pay | Admitting: Obstetrics & Gynecology

## 2021-08-23 DIAGNOSIS — N951 Menopausal and female climacteric states: Secondary | ICD-10-CM | POA: Insufficient documentation

## 2021-08-23 MED ORDER — ESTRADIOL 0.05 MG/24HR TD PTTW: 1.0000 | MEDICATED_PATCH | TRANSDERMAL | 2 refills | Status: DC
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
## 2021-09-17 ENCOUNTER — Other Ambulatory Visit (HOSPITAL_BASED_OUTPATIENT_CLINIC_OR_DEPARTMENT_OTHER): Payer: Self-pay | Admitting: *Deleted

## 2021-09-17 DIAGNOSIS — R4586 Emotional lability: Secondary | ICD-10-CM

## 2021-09-17 MED ORDER — FLUOXETINE HCL 10 MG PO TABS: 10.0000 mg | ORAL_TABLET | Freq: Every day | ORAL | 0 refills | Status: DC

## 2021-10-17 ENCOUNTER — Other Ambulatory Visit (HOSPITAL_BASED_OUTPATIENT_CLINIC_OR_DEPARTMENT_OTHER): Payer: Self-pay | Admitting: *Deleted

## 2021-10-17 DIAGNOSIS — N951 Menopausal and female climacteric states: Secondary | ICD-10-CM

## 2021-10-17 MED ORDER — ESTRADIOL 0.05 MG/24HR TD PTTW: 1.0000 | MEDICATED_PATCH | TRANSDERMAL | 1 refills | Status: DC

## 2021-11-08 ENCOUNTER — Other Ambulatory Visit (HOSPITAL_BASED_OUTPATIENT_CLINIC_OR_DEPARTMENT_OTHER): Payer: Self-pay | Admitting: Obstetrics & Gynecology

## 2021-11-08 DIAGNOSIS — N951 Menopausal and female climacteric states: Secondary | ICD-10-CM

## 2021-11-11 NOTE — Telephone Encounter (Signed)
Called pt to confirm that prescription refill for gabapentin was needed, as it was sent by her pharmacy today. Pt states that she is still using the gabapentin. She states that along with prozac have wiped out her hot flashes. She is not using the estrogen patch. Refill sent to pharmacy.

## 2021-12-19 ENCOUNTER — Other Ambulatory Visit (HOSPITAL_BASED_OUTPATIENT_CLINIC_OR_DEPARTMENT_OTHER): Payer: Self-pay | Admitting: Obstetrics & Gynecology

## 2021-12-19 DIAGNOSIS — R4586 Emotional lability: Secondary | ICD-10-CM

## 2021-12-19 MED ORDER — FLUOXETINE HCL 10 MG PO TABS: 10.0000 mg | ORAL_TABLET | Freq: Every day | ORAL | 0 refills | Status: DC

## 2021-12-24 ENCOUNTER — Telehealth (HOSPITAL_BASED_OUTPATIENT_CLINIC_OR_DEPARTMENT_OTHER): Payer: Self-pay | Admitting: Obstetrics & Gynecology

## 2021-12-24 NOTE — Telephone Encounter (Signed)
Called patient to set up a appointment for her annual and was unable to leave a message voice mail was full.

## 2022-01-02 ENCOUNTER — Ambulatory Visit (INDEPENDENT_AMBULATORY_CARE_PROVIDER_SITE_OTHER): Payer: 59 | Admitting: Obstetrics & Gynecology

## 2022-01-02 ENCOUNTER — Other Ambulatory Visit (HOSPITAL_BASED_OUTPATIENT_CLINIC_OR_DEPARTMENT_OTHER): Payer: Self-pay

## 2022-01-02 ENCOUNTER — Encounter (HOSPITAL_BASED_OUTPATIENT_CLINIC_OR_DEPARTMENT_OTHER): Payer: Self-pay | Admitting: Obstetrics & Gynecology

## 2022-01-02 VITALS — BP 141/75 | HR 82 | Ht 62.0 in | Wt 154.2 lb

## 2022-01-02 DIAGNOSIS — R4586 Emotional lability: Secondary | ICD-10-CM | POA: Diagnosis not present

## 2022-01-02 DIAGNOSIS — N951 Menopausal and female climacteric states: Secondary | ICD-10-CM | POA: Diagnosis not present

## 2022-01-02 MED ORDER — FLUOXETINE HCL 10 MG PO TABS
ORAL_TABLET | ORAL | 1 refills | Status: DC
  Filled 2022-01-02: qty 60, 30d supply, fill #0
  Filled 2022-03-05: qty 60, 30d supply, fill #1

## 2022-01-02 NOTE — Patient Instructions (Signed)
[place] -- new medication for treatment of hot flashes
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
## 2022-01-08 ENCOUNTER — Encounter (HOSPITAL_BASED_OUTPATIENT_CLINIC_OR_DEPARTMENT_OTHER): Payer: Self-pay | Admitting: *Deleted

## 2022-03-05 ENCOUNTER — Other Ambulatory Visit (HOSPITAL_BASED_OUTPATIENT_CLINIC_OR_DEPARTMENT_OTHER): Payer: Self-pay | Admitting: *Deleted

## 2022-03-05 ENCOUNTER — Encounter (HOSPITAL_BASED_OUTPATIENT_CLINIC_OR_DEPARTMENT_OTHER): Payer: Self-pay | Admitting: Obstetrics & Gynecology

## 2022-03-05 ENCOUNTER — Other Ambulatory Visit (HOSPITAL_BASED_OUTPATIENT_CLINIC_OR_DEPARTMENT_OTHER): Payer: Self-pay

## 2022-03-05 DIAGNOSIS — N951 Menopausal and female climacteric states: Secondary | ICD-10-CM

## 2022-03-05 MED ORDER — FLUOXETINE HCL 10 MG PO TABS
ORAL_TABLET | ORAL | 0 refills | Status: DC
  Filled 2022-03-05: qty 60, 30d supply, fill #0
  Filled 2022-05-05: qty 60, 30d supply, fill #1
  Filled 2022-06-03: qty 60, 30d supply, fill #2

## 2022-03-05 MED ORDER — GABAPENTIN 300 MG PO CAPS
ORAL_CAPSULE | ORAL | 0 refills | Status: DC
  Filled 2022-03-05: qty 469, 78d supply, fill #0
  Filled 2022-03-05: qty 540, 90d supply, fill #0
  Filled 2022-03-06: qty 71, 12d supply, fill #0

## 2022-03-05 NOTE — Progress Notes (Signed)
Pt requests refill on gabapentin and prozac as they are helping with managing her hot flashes. Refill sent to requested pharmacy

## 2022-03-06 ENCOUNTER — Other Ambulatory Visit (HOSPITAL_BASED_OUTPATIENT_CLINIC_OR_DEPARTMENT_OTHER): Payer: Self-pay

## 2022-03-20 ENCOUNTER — Ambulatory Visit: Payer: Self-pay

## 2022-03-20 ENCOUNTER — Encounter: Payer: Self-pay | Admitting: Physician Assistant

## 2022-03-20 ENCOUNTER — Ambulatory Visit (INDEPENDENT_AMBULATORY_CARE_PROVIDER_SITE_OTHER): Payer: 59

## 2022-03-20 ENCOUNTER — Ambulatory Visit (INDEPENDENT_AMBULATORY_CARE_PROVIDER_SITE_OTHER): Payer: 59 | Admitting: Physician Assistant

## 2022-03-20 DIAGNOSIS — M25512 Pain in left shoulder: Secondary | ICD-10-CM

## 2022-03-20 DIAGNOSIS — M25561 Pain in right knee: Secondary | ICD-10-CM
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
## 2022-03-21 ENCOUNTER — Other Ambulatory Visit (HOSPITAL_BASED_OUTPATIENT_CLINIC_OR_DEPARTMENT_OTHER): Payer: Self-pay | Admitting: Obstetrics & Gynecology

## 2022-03-21 DIAGNOSIS — N951 Menopausal and female climacteric states: Secondary | ICD-10-CM

## 2022-04-16 ENCOUNTER — Ambulatory Visit (HOSPITAL_BASED_OUTPATIENT_CLINIC_OR_DEPARTMENT_OTHER): Payer: Self-pay | Admitting: Obstetrics & Gynecology

## 2022-05-05 ENCOUNTER — Other Ambulatory Visit (HOSPITAL_BASED_OUTPATIENT_CLINIC_OR_DEPARTMENT_OTHER): Payer: Self-pay

## 2022-05-12 ENCOUNTER — Other Ambulatory Visit (HOSPITAL_BASED_OUTPATIENT_CLINIC_OR_DEPARTMENT_OTHER): Payer: Self-pay

## 2022-05-12 ENCOUNTER — Other Ambulatory Visit (HOSPITAL_BASED_OUTPATIENT_CLINIC_OR_DEPARTMENT_OTHER): Payer: Self-pay | Admitting: Obstetrics & Gynecology

## 2022-05-12 DIAGNOSIS — N951 Menopausal and female climacteric states: Secondary | ICD-10-CM

## 2022-05-13 ENCOUNTER — Other Ambulatory Visit (HOSPITAL_BASED_OUTPATIENT_CLINIC_OR_DEPARTMENT_OTHER): Payer: Self-pay

## 2022-05-13 MED ORDER — GABAPENTIN 300 MG PO CAPS
600.0000 mg | ORAL_CAPSULE | Freq: Three times a day (TID) | ORAL | 0 refills | Status: DC
  Filled 2022-05-13: qty 540, fill #0
  Filled 2022-06-03: qty 540, 90d supply, fill #0
  Filled 2022-06-12: qty 180, 30d supply, fill #0

## 2022-06-03 ENCOUNTER — Other Ambulatory Visit (HOSPITAL_BASED_OUTPATIENT_CLINIC_OR_DEPARTMENT_OTHER): Payer: Self-pay

## 2022-06-12 ENCOUNTER — Other Ambulatory Visit (HOSPITAL_BASED_OUTPATIENT_CLINIC_OR_DEPARTMENT_OTHER): Payer: Self-pay

## 2022-07-14 ENCOUNTER — Other Ambulatory Visit (HOSPITAL_BASED_OUTPATIENT_CLINIC_OR_DEPARTMENT_OTHER): Payer: Self-pay

## 2022-07-14 ENCOUNTER — Encounter (HOSPITAL_BASED_OUTPATIENT_CLINIC_OR_DEPARTMENT_OTHER): Payer: Self-pay | Admitting: Obstetrics & Gynecology

## 2022-07-14 ENCOUNTER — Ambulatory Visit (INDEPENDENT_AMBULATORY_CARE_PROVIDER_SITE_OTHER): Payer: Commercial Managed Care - PPO | Admitting: Obstetrics & Gynecology

## 2022-07-14 VITALS — BP 156/90 | HR 80 | Ht 62.0 in | Wt 154.6 lb

## 2022-07-14 DIAGNOSIS — Z01419 Encounter for gynecological examination (general) (routine) without abnormal findings: Secondary | ICD-10-CM

## 2022-07-14 DIAGNOSIS — R030 Elevated blood-pressure reading, without diagnosis of hypertension: Secondary | ICD-10-CM | POA: Diagnosis not present

## 2022-07-14 DIAGNOSIS — Z90710 Acquired absence of both cervix and uterus: Secondary | ICD-10-CM

## 2022-07-14 DIAGNOSIS — K219 Gastro-esophageal reflux disease without esophagitis: Secondary | ICD-10-CM

## 2022-07-14 DIAGNOSIS — N951 Menopausal and female climacteric states: Secondary | ICD-10-CM

## 2022-07-14 DIAGNOSIS — Z1231 Encounter for screening mammogram for malignant neoplasm of breast: Secondary | ICD-10-CM

## 2022-07-14 MED ORDER — FLUOXETINE HCL 10 MG PO TABS
ORAL_TABLET | ORAL | 3 refills | Status: DC
  Filled 2022-07-14: qty 60, 30d supply, fill #0
  Filled 2022-07-24: qty 180, 90d supply, fill #0
  Filled 2022-10-25: qty 180, 90d supply, fill #1
  Filled 2023-03-11: qty 180, 90d supply, fill #2
  Filled 2023-05-23: qty 180, 90d supply, fill #3

## 2022-07-14 MED ORDER — GABAPENTIN 300 MG PO CAPS
600.0000 mg | ORAL_CAPSULE | Freq: Three times a day (TID) | ORAL | 3 refills | Status: DC
  Filled 2022-07-14 – 2022-07-24 (×2): qty 540, 90d supply, fill #0
  Filled 2022-10-25: qty 540, 90d supply, fill #1
  Filled 2023-03-11: qty 540, 90d supply, fill #2
  Filled 2023-05-23: qty 540, 90d supply, fill #3
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
## 2022-07-14 NOTE — Patient Instructions (Signed)
Call [phone] to schedule an appointment at [facility].    Call [phone] to scheduled at [facility], [address], [address].  , [place] [address]  Mammograms can also be self-scheduled online through MyChart.  

## 2022-07-24 ENCOUNTER — Other Ambulatory Visit (HOSPITAL_BASED_OUTPATIENT_CLINIC_OR_DEPARTMENT_OTHER): Payer: Self-pay

## 2022-07-25 ENCOUNTER — Other Ambulatory Visit (HOSPITAL_BASED_OUTPATIENT_CLINIC_OR_DEPARTMENT_OTHER): Payer: Self-pay

## 2022-07-28 ENCOUNTER — Encounter: Payer: Self-pay | Admitting: *Deleted

## 2022-08-02 ENCOUNTER — Ambulatory Visit (HOSPITAL_BASED_OUTPATIENT_CLINIC_OR_DEPARTMENT_OTHER)
Admission: RE | Admit: 2022-08-02 | Discharge: 2022-08-02 | Disposition: A | Discharge status: Home / Self Care | Payer: Commercial Managed Care - PPO | Source: Ambulatory Visit | Attending: Obstetrics & Gynecology | Admitting: Obstetrics & Gynecology

## 2022-08-02 DIAGNOSIS — Z1231 Encounter for screening mammogram for malignant neoplasm of breast: Secondary | ICD-10-CM | POA: Diagnosis not present

## 2022-10-25 ENCOUNTER — Other Ambulatory Visit (HOSPITAL_BASED_OUTPATIENT_CLINIC_OR_DEPARTMENT_OTHER): Payer: Self-pay

## 2023-03-11 ENCOUNTER — Other Ambulatory Visit (HOSPITAL_BASED_OUTPATIENT_CLINIC_OR_DEPARTMENT_OTHER): Payer: Self-pay

## 2023-04-15 ENCOUNTER — Other Ambulatory Visit (HOSPITAL_BASED_OUTPATIENT_CLINIC_OR_DEPARTMENT_OTHER): Payer: Self-pay

## 2023-04-17 ENCOUNTER — Emergency Department (HOSPITAL_COMMUNITY)
Admission: EM | Admit: 2023-04-17 | Discharge: 2023-04-17 | Disposition: A | Discharge status: Home / Self Care | Payer: Commercial Managed Care - PPO | Attending: Emergency Medicine | Admitting: Emergency Medicine

## 2023-04-17 ENCOUNTER — Other Ambulatory Visit: Payer: Self-pay

## 2023-04-17 ENCOUNTER — Emergency Department (HOSPITAL_COMMUNITY): Payer: Commercial Managed Care - PPO

## 2023-04-17 DIAGNOSIS — S060X1A Concussion with loss of consciousness of 30 minutes or less, initial encounter: Secondary | ICD-10-CM | POA: Diagnosis not present

## 2023-04-17 DIAGNOSIS — S0003XA Contusion of scalp, initial encounter: Secondary | ICD-10-CM | POA: Insufficient documentation

## 2023-04-17 DIAGNOSIS — W108XXA Fall (on) (from) other stairs and steps, initial encounter: Secondary | ICD-10-CM | POA: Diagnosis not present

## 2023-04-17 DIAGNOSIS — I10 Essential (primary) hypertension: Secondary | ICD-10-CM | POA: Insufficient documentation

## 2023-04-17 DIAGNOSIS — S0990XA Unspecified injury of head, initial encounter: Secondary | ICD-10-CM | POA: Diagnosis present

## 2023-04-17 MED ORDER — ACETAMINOPHEN 325 MG PO TABS
650.0000 mg | ORAL_TABLET | Freq: Once | ORAL | Status: AC
  Administered 2023-04-17: 650 mg via ORAL
  Filled 2023-04-17: qty 2

## 2023-04-17 NOTE — ED Triage Notes (Signed)
Pt arrived via POV. Pt tripped down 8 steps and hit head on R side. Pt had temp LOC. Not on blood thinners

## 2023-04-17 NOTE — ED Provider Notes (Signed)
[place] EMERGENCY DEPARTMENT AT [facility] Provider Note   CSN: [id number] Arrival date & time: 04/17/23  1815     History  Chief Complaint  Patient presents with   Fall   Head Injury    Michelle Combs is a 52 y.o. female.   Fall  Head Injury    Patient has history of depression anxiety endometriosis.  Patient presented to the ED for evaluation of a head injury after a fall.  Patient states she was walking down steps at a wedding ended up falling going down 8 steps.  Patient struck her head.  She does think she lost consciousness.  Patient is having pain in her head since the fall.  She denies any other injuries in her extremities.  No chest pain no abdominal pain.  No focal numbness or weakness.  No vomiting.  Home Medications Prior to Admission medications   Medication Sig Start Date End Date Taking? Authorizing Provider  FLUoxetine (PROZAC) 10 MG tablet Take 1-2 tablets by mouth once daily as needed for hot flashes. 07/14/22   Jerene Bears, MD  gabapentin (NEURONTIN) 300 MG capsule Take 2 capsules (600 mg total) by mouth 3 (three) times daily. 07/14/22   Jerene Bears, MD  valACYclovir (VALTREX) 1000 MG tablet Two tabs at onset and repeat in 12 hours for fever blisters 05/07/21   Jerene Bears, MD      Allergies    Demeclocycline, Prednisone, Tetracyclines & related, and Aloe    Review of Systems   Review of Systems  Physical Exam Updated Vital Signs BP (!) 165/92   Pulse 100   Temp 98 F (36.7 C) (Oral)   Resp 16   Ht 1.575 m (5\' 2" )   Wt 68 kg   LMP 06/09/2006 (Approximate)   SpO2 96%   BMI 27.44 kg/m  Physical Exam Vitals and nursing note reviewed.  Constitutional:      General: She is not in acute distress.    Appearance: She is well-developed.  HENT:     Head: Normocephalic.     Comments: Hematoma posterior occiput    Right Ear: External ear normal.     Left Ear: External ear normal.  Eyes:     General: No scleral icterus.        Right eye: No discharge.        Left eye: No discharge.     Conjunctiva/sclera: Conjunctivae normal.  Neck:     Trachea: No tracheal deviation.  Cardiovascular:     Rate and Rhythm: Normal rate and regular rhythm.  Pulmonary:     Effort: Pulmonary effort is normal. No respiratory distress.     Breath sounds: Normal breath sounds. No stridor. No wheezing or rales.  Abdominal:     General: Bowel sounds are normal. There is no distension.     Palpations: Abdomen is soft.     Tenderness: There is no abdominal tenderness. There is no guarding or rebound.  Musculoskeletal:        General: No tenderness or deformity.     Cervical back: Neck supple.     Comments: No tenderness palpation thoracic or lumbar spine, no tenderness palpation upper extremities or lower extremities patient is in a C-spine collar; hematoma noted left lateral thigh  Skin:    General: Skin is warm and dry.     Findings: No rash.  Neurological:     General: No focal deficit present.     Mental Status:  She is alert.     Cranial Nerves: No cranial nerve deficit, dysarthria or facial asymmetry.     Sensory: No sensory deficit.     Motor: No abnormal muscle tone or seizure activity.     Coordination: Coordination normal.  Psychiatric:        Mood and Affect: Mood normal.     ED Results / Procedures / Treatments   Labs (all labs ordered are listed, but only abnormal results are displayed) Labs Reviewed - No data to display  EKG None  Radiology CT Cervical Spine Wo Contrast  Result Date: 04/17/2023 CLINICAL DATA:  Status post trauma. EXAM: CT CERVICAL SPINE WITHOUT CONTRAST TECHNIQUE: Multidetector CT imaging of the cervical spine was performed without intravenous contrast. Multiplanar CT image reconstructions were also generated. RADIATION DOSE REDUCTION: This exam was performed according to the departmental dose-optimization program which includes automated exposure control, adjustment of the mA and/or kV  according to patient size and/or use of iterative reconstruction technique. COMPARISON:  None Available. FINDINGS: Alignment: There is straightening of the normal cervical spine lordosis. Skull base and vertebrae: No acute fracture. No primary bone lesion or focal pathologic process. Soft tissues and spinal canal: No prevertebral fluid or swelling. No visible canal hematoma. Disc levels: Normal multilevel endplates are seen with normal multilevel intervertebral disc spaces. Mild, bilateral multilevel facet joint hypertrophy is noted. Upper chest: Negative. Other: None. IMPRESSION: 1. Straightening of the normal cervical spine lordosis, which may be secondary to patient positioning or muscular spasm. 2. No acute cervical spine fracture or subluxation. 3. Mild multilevel degenerative changes. Electronically Signed   By: Aram Candela M.D.   On: 04/17/2023 22:23   CT Head Wo Contrast  Result Date: 04/17/2023 CLINICAL DATA:  Status post trauma. EXAM: CT HEAD WITHOUT CONTRAST TECHNIQUE: Contiguous axial images were obtained from the base of the skull through the vertex without intravenous contrast. RADIATION DOSE REDUCTION: This exam was performed according to the departmental dose-optimization program which includes automated exposure control, adjustment of the mA and/or kV according to patient size and/or use of iterative reconstruction technique. COMPARISON:  None Available. FINDINGS: Brain: No evidence of acute infarction, hemorrhage, hydrocephalus, extra-axial collection or mass lesion/mass effect. Vascular: No hyperdense vessel or unexpected calcification. Skull: Normal. Negative for fracture or focal lesion. Sinuses/Orbits: No acute finding. Other: Mild right parietooccipital scalp soft tissue swelling is noted. IMPRESSION: Mild right parietooccipital scalp soft tissue swelling without an acute intracranial abnormality. Electronically Signed   By: Aram Candela M.D.   On: 04/17/2023 22:21     Procedures Procedures    Medications Ordered in ED Medications  acetaminophen (TYLENOL) tablet 650 mg (650 mg Oral Given 04/17/23 2103)    ED Course/ Medical Decision Making/ A&P                                 Medical Decision Making Amount and/or Complexity of Data Reviewed Radiology: ordered.  Risk OTC drugs.   Patient presented to the ED for evaluation after head injury.  Patient did have brief loss of consciousness was complaining of headache.  CT scan fortunately does not show any signs of serious injury.  No evidence of C-spine fracture.  Patient does have a contusion on the scalp.  Patient also noted to have soft tissue contusion on her left thigh.  Patient is able to ambulate without difficulty.    Patient noted to be hypertensive in the ED.  Patient states she has been noted to have elevated blood pressures in the past.  Patient will follow-up with her PCP        Final Clinical Impression(s) / ED Diagnoses Final diagnoses:  Concussion with loss of consciousness of 30 minutes or less, initial encounter  Hypertension, unspecified type    Rx / DC Orders ED Discharge Orders     None         Linwood Dibbles, MD 04/17/23 2254

## 2023-04-17 NOTE — Discharge Instructions (Addendum)
Take over-the-counter medications such as Tylenol or ibuprofen.  Apply ice to help with the swelling.

## 2023-05-14 ENCOUNTER — Encounter (HOSPITAL_BASED_OUTPATIENT_CLINIC_OR_DEPARTMENT_OTHER): Payer: Self-pay

## 2023-05-14 ENCOUNTER — Other Ambulatory Visit (HOSPITAL_BASED_OUTPATIENT_CLINIC_OR_DEPARTMENT_OTHER): Payer: Self-pay

## 2023-05-23 ENCOUNTER — Other Ambulatory Visit (HOSPITAL_BASED_OUTPATIENT_CLINIC_OR_DEPARTMENT_OTHER): Payer: Self-pay

## 2023-05-25 ENCOUNTER — Other Ambulatory Visit (HOSPITAL_BASED_OUTPATIENT_CLINIC_OR_DEPARTMENT_OTHER): Payer: Self-pay

## 2023-05-29 ENCOUNTER — Other Ambulatory Visit (HOSPITAL_BASED_OUTPATIENT_CLINIC_OR_DEPARTMENT_OTHER): Payer: Self-pay

## 2023-07-27 ENCOUNTER — Ambulatory Visit (HOSPITAL_BASED_OUTPATIENT_CLINIC_OR_DEPARTMENT_OTHER): Payer: Self-pay | Admitting: Obstetrics & Gynecology

## 2023-07-28 ENCOUNTER — Encounter (HOSPITAL_BASED_OUTPATIENT_CLINIC_OR_DEPARTMENT_OTHER): Payer: Self-pay | Admitting: Obstetrics & Gynecology

## 2023-09-08 ENCOUNTER — Other Ambulatory Visit (HOSPITAL_BASED_OUTPATIENT_CLINIC_OR_DEPARTMENT_OTHER): Payer: Self-pay | Admitting: Physician Assistant

## 2023-09-08 ENCOUNTER — Ambulatory Visit (HOSPITAL_BASED_OUTPATIENT_CLINIC_OR_DEPARTMENT_OTHER): Payer: Commercial Managed Care - PPO | Admitting: Obstetrics & Gynecology

## 2023-09-08 DIAGNOSIS — Z1231 Encounter for screening mammogram for malignant neoplasm of breast: Secondary | ICD-10-CM

## 2023-09-09 ENCOUNTER — Other Ambulatory Visit (HOSPITAL_BASED_OUTPATIENT_CLINIC_OR_DEPARTMENT_OTHER): Payer: Self-pay | Admitting: Obstetrics & Gynecology

## 2023-09-09 DIAGNOSIS — N951 Menopausal and female climacteric states: Secondary | ICD-10-CM

## 2023-09-10 ENCOUNTER — Other Ambulatory Visit (HOSPITAL_BASED_OUTPATIENT_CLINIC_OR_DEPARTMENT_OTHER): Payer: Self-pay

## 2023-09-10 MED ORDER — GABAPENTIN 300 MG PO CAPS
600.0000 mg | ORAL_CAPSULE | Freq: Three times a day (TID) | ORAL | 1 refills | Status: DC
  Filled 2023-09-10: qty 540, 90d supply, fill #0
  Filled 2023-10-31: qty 180, 30d supply, fill #0

## 2023-09-21 ENCOUNTER — Other Ambulatory Visit (HOSPITAL_BASED_OUTPATIENT_CLINIC_OR_DEPARTMENT_OTHER): Payer: Self-pay

## 2023-10-31 ENCOUNTER — Other Ambulatory Visit (HOSPITAL_BASED_OUTPATIENT_CLINIC_OR_DEPARTMENT_OTHER): Payer: Self-pay

## 2023-12-08 ENCOUNTER — Ambulatory Visit (HOSPITAL_BASED_OUTPATIENT_CLINIC_OR_DEPARTMENT_OTHER): Admitting: Obstetrics & Gynecology

## 2023-12-08 ENCOUNTER — Ambulatory Visit (HOSPITAL_BASED_OUTPATIENT_CLINIC_OR_DEPARTMENT_OTHER): Admitting: Radiology

## 2023-12-10 ENCOUNTER — Encounter (HOSPITAL_BASED_OUTPATIENT_CLINIC_OR_DEPARTMENT_OTHER): Payer: Self-pay | Admitting: Obstetrics & Gynecology

## 2023-12-10 ENCOUNTER — Ambulatory Visit (HOSPITAL_BASED_OUTPATIENT_CLINIC_OR_DEPARTMENT_OTHER): Admitting: Obstetrics & Gynecology

## 2023-12-10 VITALS — BP 135/80 | HR 83 | Wt 145.8 lb

## 2023-12-10 DIAGNOSIS — R4586 Emotional lability: Secondary | ICD-10-CM

## 2023-12-10 DIAGNOSIS — N809 Endometriosis, unspecified: Secondary | ICD-10-CM

## 2023-12-10 DIAGNOSIS — Z01411 Encounter for gynecological examination (general) (routine) with abnormal findings: Secondary | ICD-10-CM

## 2023-12-10 DIAGNOSIS — N941 Unspecified dyspareunia: Secondary | ICD-10-CM

## 2023-12-10 DIAGNOSIS — B009 Herpesviral infection, unspecified: Secondary | ICD-10-CM

## 2023-12-10 DIAGNOSIS — Z01419 Encounter for gynecological examination (general) (routine) without abnormal findings: Secondary | ICD-10-CM

## 2023-12-10 MED ORDER — VALACYCLOVIR HCL 1 G PO TABS: ORAL_TABLET | ORAL | 1 refills | Status: AC

## 2023-12-10 MED ORDER — SERTRALINE HCL 50 MG PO TABS: 50.0000 mg | ORAL_TABLET | Freq: Every day | ORAL | 2 refills | Status: AC

## 2023-12-10 NOTE — Progress Notes (Signed)
 ANNUAL EXAM Patient name: Michelle Combs MRN [id number]  Date of birth: 03-21-1971 Chief Complaint:   AEX  History of Present Illness:   Michelle Combs is a 53 y.o. [phone] Caucasian female being seen today for a routine annual exam.  Reports last week she woke up with left lower quadrant pain.  Ended up resting the first day.  This was associated with nausea and low grade fever.  She had increased pressure and burning with urination.  Within three days, it resolved.  She did see at PA at urgent care.  Denies vaginal bleeding.    Has worked on weight loss.  Using Mounjaro.  Feels better with weight loss.  Feeling tearful at times for no good reason.  Would like to consider options.  Sertraline discussed.    Does need RF for fever blisters.   Patient's last menstrual period was 06/09/2006 (approximate).  Last pap hysterectomy. Last mammogram: 08/02/2022. Results were: normal. Family h/o breast cancer: no Last colonoscopy: 02/08/2019.  Follow up 10 years.       07/14/2022   11:10 AM 01/02/2022    4:12 PM 08/22/2021    2:51 PM  Depression screen PHQ 2/9  Decreased Interest 0 0 0  Down, Depressed, Hopeless 0 0 0  PHQ - 2 Score 0 0 0         No data to display           Review of Systems:   Pertinent items are noted in HPI Denies any headaches, blurred vision, fatigue, shortness of breath, chest pain, abdominal pain, abnormal vaginal discharge/itching/odor/irritation, problems with periods, bowel movements, urination, or intercourse unless otherwise stated above. Pertinent History Reviewed:  Reviewed past medical,surgical, social and family history.  Reviewed problem list, medications and allergies. Physical Assessment:   Vitals:   12/10/23 1329  BP: 135/80  Pulse: 83  SpO2: 100%  Weight: 145 lb 12.8 oz (66.1 kg)  Body mass index is 26.67 kg/m.        Physical Examination:   General appearance - well appearing, and in no distress  Mental status - alert, oriented to  person, place, and time  Psych:  She has a normal mood and affect  Skin - warm and dry, normal color, no suspicious lesions noted  Chest - effort normal, all lung fields clear to auscultation bilaterally  Heart - normal rate and regular rhythm  Neck:  midline trachea, no thyromegaly or nodules  Breasts - breasts appear normal, no suspicious masses, no skin or nipple changes or  axillary nodes  Abdomen - soft, nontender, nondistended, no masses or organomegaly  Pelvic - VULVA: normal appearing vulva with no masses, tenderness or lesions   VAGINA: normal appearing vagina with normal color and discharge, no lesions   CERVIX: surgically absent  Thin prep pap is not indicated  UTERUS: surgically absent  ADNEXA: No adnexal masses or tenderness noted.  Rectal - normal rectal, good sphincter tone, no masses felt.  Extremities:  No swelling or varicosities noted  Chaperone present for exam  No results found for this or any previous visit (from the past 24 hours).  Assessment & Plan:  1. Well woman exam with routine gynecological exam (Primary) - Pap smear not indicated - Mammogram scheduled 01/2024 - Colonoscopy 2020.  Follow up 10 years. - Bone mineral density discussed.   - lab work done about 5 months ago - vaccines reviewed/updated  2. HSV-1 (herpes simplex virus 1) infection - valACYclovir  (VALTREX ) 1000  MG tablet; Two tabs at onset and repeat in 12 hours for fever blisters  Dispense: 30 tablet; Refill: 1  3. Emotional lability - sertraline (ZOLOFT) 50 MG tablet; Take 1 tablet (50 mg total) by mouth daily.  Dispense: 30 tablet; Refill: 2  4. Dyspareunia in female  5. Endometriosis, history  6.  Vasomotor symptoms - on gapabentin 300mg  TID.  Does not need RF yet.  Will let me know when she does.   No orders of the defined types were placed in this encounter.   Meds:  Meds ordered this encounter  Medications   valACYclovir  (VALTREX ) 1000 MG tablet    Sig: Two tabs at onset  and repeat in 12 hours for fever blisters    Dispense:  30 tablet    Refill:  1   sertraline (ZOLOFT) 50 MG tablet    Sig: Take 1 tablet (50 mg total) by mouth daily.    Dispense:  30 tablet    Refill:  2    Follow-up: Return in about 1 year (around 12/09/2024).  Ronal GORMAN [place], MD 12/10/2023 2:15 PM

## 2024-01-13 ENCOUNTER — Ambulatory Visit (HOSPITAL_BASED_OUTPATIENT_CLINIC_OR_DEPARTMENT_OTHER): Admitting: Obstetrics & Gynecology

## 2024-01-25 ENCOUNTER — Ambulatory Visit (HOSPITAL_BASED_OUTPATIENT_CLINIC_OR_DEPARTMENT_OTHER): Admission: RE | Admit: 2024-01-25 | Payer: Self-pay | Source: Ambulatory Visit | Admitting: Radiology

## 2024-04-19 DIAGNOSIS — H25043 Posterior subcapsular polar age-related cataract, bilateral: Secondary | ICD-10-CM | POA: Diagnosis not present

## 2024-04-21 ENCOUNTER — Other Ambulatory Visit (HOSPITAL_BASED_OUTPATIENT_CLINIC_OR_DEPARTMENT_OTHER): Payer: Self-pay | Admitting: Obstetrics & Gynecology

## 2024-04-21 DIAGNOSIS — N951 Menopausal and female climacteric states: Secondary | ICD-10-CM

## 2024-04-22 DIAGNOSIS — Z0000 Encounter for general adult medical examination without abnormal findings: Secondary | ICD-10-CM | POA: Diagnosis not present

## 2024-04-22 DIAGNOSIS — E663 Overweight: Secondary | ICD-10-CM | POA: Diagnosis not present

## 2024-04-22 DIAGNOSIS — F1020 Alcohol dependence, uncomplicated: Secondary | ICD-10-CM | POA: Diagnosis not present

## 2024-04-22 DIAGNOSIS — F5101 Primary insomnia: Secondary | ICD-10-CM | POA: Diagnosis not present

## 2024-04-22 DIAGNOSIS — Z13220 Encounter for screening for lipoid disorders: Secondary | ICD-10-CM | POA: Diagnosis not present

## 2024-04-29 DIAGNOSIS — R1032 Left lower quadrant pain: Secondary | ICD-10-CM | POA: Diagnosis not present

## 2024-04-29 DIAGNOSIS — R197 Diarrhea, unspecified: Secondary | ICD-10-CM | POA: Diagnosis not present

## 2024-04-29 DIAGNOSIS — R112 Nausea with vomiting, unspecified: Secondary | ICD-10-CM | POA: Diagnosis not present

## 2024-12-13 ENCOUNTER — Ambulatory Visit (HOSPITAL_BASED_OUTPATIENT_CLINIC_OR_DEPARTMENT_OTHER): Admitting: Obstetrics & Gynecology
